# Patient Record
Sex: Female | Born: 1970
Health system: Southern US, Community
[De-identification: ages and names within clinical notes are randomized; demographics above are authoritative.]

## PROBLEM LIST (undated history)

## (undated) DIAGNOSIS — Z8489 Family history of other specified conditions: Secondary | ICD-10-CM

## (undated) DIAGNOSIS — N840 Polyp of corpus uteri: Secondary | ICD-10-CM

## (undated) DIAGNOSIS — M1990 Unspecified osteoarthritis, unspecified site: Secondary | ICD-10-CM

---

## 1994-08-20 HISTORY — PX: DILATION AND CURETTAGE OF UTERUS: SHX78

## 2007-09-08 ENCOUNTER — Other Ambulatory Visit: Admission: RE | Admit: 2007-09-08 | Discharge: 2007-09-08 | Payer: Self-pay | Admitting: Emergency Medicine

## 2008-12-03 ENCOUNTER — Ambulatory Visit (HOSPITAL_COMMUNITY): Admission: RE | Admit: 2008-12-03 | Discharge: 2008-12-03 | Payer: Self-pay | Admitting: Emergency Medicine

## 2008-12-07 ENCOUNTER — Other Ambulatory Visit: Admission: RE | Admit: 2008-12-07 | Discharge: 2008-12-07 | Payer: Self-pay | Admitting: Emergency Medicine

## 2010-05-18 ENCOUNTER — Other Ambulatory Visit: Admission: RE | Admit: 2010-05-18 | Discharge: 2010-05-18 | Payer: Self-pay | Admitting: Family Medicine

## 2011-04-03 ENCOUNTER — Other Ambulatory Visit (HOSPITAL_COMMUNITY): Payer: Self-pay | Admitting: Family Medicine

## 2011-04-03 DIAGNOSIS — Z1231 Encounter for screening mammogram for malignant neoplasm of breast: Secondary | ICD-10-CM

## 2011-04-06 ENCOUNTER — Ambulatory Visit (HOSPITAL_COMMUNITY)
Admission: RE | Admit: 2011-04-06 | Discharge: 2011-04-06 | Disposition: A | Discharge status: Home / Self Care | Payer: BC Managed Care – PPO | Source: Ambulatory Visit | Attending: Family Medicine | Admitting: Family Medicine

## 2011-04-06 DIAGNOSIS — Z1231 Encounter for screening mammogram for malignant neoplasm of breast: Secondary | ICD-10-CM | POA: Insufficient documentation

## 2011-05-22 ENCOUNTER — Other Ambulatory Visit: Payer: Self-pay | Admitting: Family Medicine

## 2011-05-22 ENCOUNTER — Other Ambulatory Visit (HOSPITAL_COMMUNITY)
Admission: RE | Admit: 2011-05-22 | Discharge: 2011-05-22 | Disposition: A | Discharge status: Home / Self Care | Payer: BC Managed Care – PPO | Source: Ambulatory Visit | Attending: Family Medicine | Admitting: Family Medicine

## 2011-05-22 DIAGNOSIS — Z1159 Encounter for screening for other viral diseases: Secondary | ICD-10-CM | POA: Insufficient documentation

## 2011-05-22 DIAGNOSIS — Z124 Encounter for screening for malignant neoplasm of cervix: Secondary | ICD-10-CM | POA: Insufficient documentation

## 2011-09-13 ENCOUNTER — Ambulatory Visit (INDEPENDENT_AMBULATORY_CARE_PROVIDER_SITE_OTHER): Payer: BC Managed Care – PPO | Admitting: Women's Health

## 2011-09-13 ENCOUNTER — Encounter: Payer: Self-pay | Admitting: Women's Health

## 2011-09-13 VITALS — BP 126/80 | Ht 65.25 in | Wt 158.0 lb

## 2011-09-13 DIAGNOSIS — N938 Other specified abnormal uterine and vaginal bleeding: Secondary | ICD-10-CM

## 2011-09-13 DIAGNOSIS — N949 Unspecified condition associated with female genital organs and menstrual cycle: Secondary | ICD-10-CM

## 2011-09-13 LAB — TSH: TSH: 2.655 u[IU]/mL (ref 0.350–4.500)

## 2011-09-13 LAB — PREGNANCY, URINE: Preg Test, Ur: NEGATIVE

## 2011-09-13 NOTE — Progress Notes (Signed)
Patient ID: Cynthia Trevino, female   DOB: September 27, 1970, 41 y.o.   MRN: [id number] New patient to our practice with a problem of DUB. 40yo MWF G2 P2 one partner for 20 years.  Was seen at [facility] family practice in October, had a normal Pap and mammogram was having some lower left sided pain at that time, has basically resolved. Has been having spotting between her periods for the last 3 months. She does normally have a monthly cycle for 4-5 days uses withdrawal for contraception. History of  abnormal Paps, LEEP in 96 with normal Paps after.  Exam: Moderate amount of menses type blood, cervix is pink healthy without polyp or lesion. Bimanual no CMT or adnexal fullness or tenderness.  DUB for 3 months  Plan: Contraception options were reviewed, Mirena IUD information given reviewed, reviewed slight risk for infection, perforation, hemorrhage. Instructed to schedule with Dr. Audie Box to place with cycle it so desires. TSH, prolactin pending, U PT negative. Sonohysterogram with Dr. Audie Box after her cycle.

## 2011-09-14 LAB — PROLACTIN: Prolactin: 5.6 ng/mL

## 2011-09-18 ENCOUNTER — Ambulatory Visit (INDEPENDENT_AMBULATORY_CARE_PROVIDER_SITE_OTHER): Payer: BC Managed Care – PPO | Admitting: Gynecology

## 2011-09-18 ENCOUNTER — Other Ambulatory Visit: Payer: BC Managed Care – PPO

## 2011-09-18 ENCOUNTER — Ambulatory Visit (INDEPENDENT_AMBULATORY_CARE_PROVIDER_SITE_OTHER): Payer: BC Managed Care – PPO

## 2011-09-18 ENCOUNTER — Ambulatory Visit: Payer: BC Managed Care – PPO | Admitting: Gynecology

## 2011-09-18 ENCOUNTER — Encounter: Payer: Self-pay | Admitting: Gynecology

## 2011-09-18 ENCOUNTER — Other Ambulatory Visit: Payer: Self-pay | Admitting: Gynecology

## 2011-09-18 DIAGNOSIS — N949 Unspecified condition associated with female genital organs and menstrual cycle: Secondary | ICD-10-CM

## 2011-09-18 DIAGNOSIS — N8300 Follicular cyst of ovary, unspecified side: Secondary | ICD-10-CM

## 2011-09-18 DIAGNOSIS — Z309 Encounter for contraceptive management, unspecified: Secondary | ICD-10-CM

## 2011-09-18 DIAGNOSIS — R1032 Left lower quadrant pain: Secondary | ICD-10-CM

## 2011-09-18 DIAGNOSIS — N938 Other specified abnormal uterine and vaginal bleeding: Secondary | ICD-10-CM

## 2011-09-18 NOTE — Patient Instructions (Signed)
Followup for IUD placement as we discussed. 

## 2011-09-18 NOTE — Progress Notes (Signed)
Patient follows up for sonohysterogram having recently seen Harriett Sine with midcycle breakthrough bleeding. Menses are otherwise regular and relatively light.  Ultrasound shows endometrial echo 6.2 mm. Homogeneous normal-appearing myometrium. Right and left ovaries with physiologic changes. Cul-de-sac negative. Sonohysterogram was performed, sterile technique, easy catheter introduction, good distention with no abnormalities. Endometrial sample was taken. Patient tolerated well.  Discussed with patient, I think that her breakthrough bleeding is a dysfunctional pattern. She does want to proceed with a Mirena IUD which I think certainly will provide a resolution to her spotting. She will return during her menses for placement. I reviewed the insertion process with her and the immediate risks to include infection and perforation requiring surgery to remove as well as the long-term issues of infection, migration,  as well as failure risks with pregnancy. Patient understands and will follow up for IUD placement.

## 2011-09-19 ENCOUNTER — Other Ambulatory Visit: Payer: Self-pay | Admitting: *Deleted

## 2011-09-19 DIAGNOSIS — Z3049 Encounter for surveillance of other contraceptives: Secondary | ICD-10-CM

## 2011-09-19 MED ORDER — LEVONORGESTREL 20 MCG/24HR IU IUD: INTRAUTERINE_SYSTEM | Freq: Once | INTRAUTERINE | Status: DC

## 2011-09-19 NOTE — Progress Notes (Signed)
Patient informed Mirena covered at 100%.

## 2011-10-09 ENCOUNTER — Encounter: Payer: Self-pay | Admitting: Gynecology

## 2011-10-09 ENCOUNTER — Ambulatory Visit (INDEPENDENT_AMBULATORY_CARE_PROVIDER_SITE_OTHER): Payer: BC Managed Care – PPO | Admitting: Gynecology

## 2011-10-09 DIAGNOSIS — Z30430 Encounter for insertion of intrauterine contraceptive device: Secondary | ICD-10-CM

## 2011-10-09 NOTE — Patient Instructions (Signed)
Follow up in one month for IUD check

## 2011-10-09 NOTE — Progress Notes (Signed)
Patient presents for Mirena IUD placement. Recently had sonohysterogram which was negative an endometrial sample showed a small benign polyp that was not appreciated on the sonohysterogram. Patient has read through the booklet has no questions and signed the consent form. I reviewed the insertional process with her and the risks to include the short-term risks of infection and perforation requiring surgery to remove as well as long-term risks of infection, migration, pregnancy. Patient understands and agrees. She is currently on a normal menses.  Exam with Sherrilyn Rist chaperone present external BUS vagina normal with slight menses flow. Cervix normal. Uterus anteverted normal size midline mobile nontender. Adnexa without masses or tenderness  Procedure: Cervix was cleansed with Betadine, anterior lip grasped with a single-tooth tenaculum, sounded and a Mirena IUD was placed according to manufacturer's recommendations without difficulty. The strings were trimmed. The patient tolerated well and will follow up in one month for postinsertional check.

## 2011-10-10 ENCOUNTER — Other Ambulatory Visit: Payer: Self-pay | Admitting: Gynecology

## 2011-10-10 DIAGNOSIS — Z3049 Encounter for surveillance of other contraceptives: Secondary | ICD-10-CM

## 2011-10-22 ENCOUNTER — Telehealth: Payer: Self-pay | Admitting: *Deleted

## 2011-10-22 NOTE — Telephone Encounter (Signed)
Recommend OV to let me take a look.

## 2011-10-22 NOTE — Telephone Encounter (Signed)
Pt informed with the below note. 

## 2011-10-22 NOTE — Telephone Encounter (Signed)
Pt had IUD placement on feb 19. Pt said that she tried to feel for her string on Saturday for the first time and couldn't find it and tried again yesterday and still no string. She has no other complains. Pt wanted you to be aware of this. Please advise

## 2011-10-23 ENCOUNTER — Ambulatory Visit (INDEPENDENT_AMBULATORY_CARE_PROVIDER_SITE_OTHER): Payer: BC Managed Care – PPO | Admitting: Gynecology

## 2011-10-23 ENCOUNTER — Encounter: Payer: Self-pay | Admitting: Gynecology

## 2011-10-23 DIAGNOSIS — Z30431 Encounter for routine checking of intrauterine contraceptive device: Secondary | ICD-10-CM

## 2011-10-23 NOTE — Patient Instructions (Signed)
Follow up in the fall 2013 for your annual exam

## 2011-10-23 NOTE — Progress Notes (Signed)
Patient presents because she could not feel the IUD string. She had recently placed 10/09/2011. She's not having any pain or any problems with it no abnormal bleeding.  Exam with a knee chaperone present. Pelvic external BUS vagina normal. Cervix normal with IUD string visualized. I showed it to the patient with a mirror. Bimanual uterus normal size midline mobile nontender. Adnexa without masses or tenderness.  Assessment and plan: IUD check normal. Patient will keep menstrual calendar, as long as she continues well then she'll follow up in the fall when she is due for her annual exam.

## 2011-11-06 ENCOUNTER — Ambulatory Visit: Payer: BC Managed Care – PPO | Admitting: Gynecology

## 2012-05-05 ENCOUNTER — Ambulatory Visit
Admission: RE | Admit: 2012-05-05 | Discharge: 2012-05-05 | Disposition: A | Discharge status: Home / Self Care | Payer: BC Managed Care – PPO | Source: Ambulatory Visit | Attending: Family Medicine | Admitting: Family Medicine

## 2012-05-05 ENCOUNTER — Other Ambulatory Visit: Payer: Self-pay | Admitting: Family Medicine

## 2012-05-05 DIAGNOSIS — R109 Unspecified abdominal pain: Secondary | ICD-10-CM

## 2012-05-05 MED ORDER — IOHEXOL 300 MG/ML  SOLN
100.0000 mL | Freq: Once | INTRAMUSCULAR | Status: AC | PRN
  Administered 2012-05-05: 100 mL via INTRAVENOUS

## 2012-06-05 ENCOUNTER — Other Ambulatory Visit (HOSPITAL_COMMUNITY): Payer: Self-pay | Admitting: Family Medicine

## 2012-06-05 DIAGNOSIS — Z1231 Encounter for screening mammogram for malignant neoplasm of breast: Secondary | ICD-10-CM

## 2012-06-09 ENCOUNTER — Ambulatory Visit (HOSPITAL_COMMUNITY)
Admission: RE | Admit: 2012-06-09 | Discharge: 2012-06-09 | Disposition: A | Discharge status: Home / Self Care | Payer: BC Managed Care – PPO | Source: Ambulatory Visit | Attending: Family Medicine | Admitting: Family Medicine

## 2012-06-09 DIAGNOSIS — Z1231 Encounter for screening mammogram for malignant neoplasm of breast: Secondary | ICD-10-CM

## 2012-06-10 ENCOUNTER — Other Ambulatory Visit: Payer: Self-pay | Admitting: Family Medicine

## 2012-06-10 DIAGNOSIS — N63 Unspecified lump in unspecified breast: Secondary | ICD-10-CM

## 2012-06-13 ENCOUNTER — Ambulatory Visit
Admission: RE | Admit: 2012-06-13 | Discharge: 2012-06-13 | Disposition: A | Discharge status: Home / Self Care | Payer: BC Managed Care – PPO | Source: Ambulatory Visit | Attending: Family Medicine | Admitting: Family Medicine

## 2012-06-13 DIAGNOSIS — N63 Unspecified lump in unspecified breast: Secondary | ICD-10-CM

## 2012-06-19 ENCOUNTER — Ambulatory Visit (HOSPITAL_COMMUNITY): Payer: BC Managed Care – PPO

## 2012-09-15 ENCOUNTER — Encounter: Payer: Self-pay | Admitting: Women's Health

## 2012-09-15 ENCOUNTER — Other Ambulatory Visit (HOSPITAL_COMMUNITY)
Admission: RE | Admit: 2012-09-15 | Discharge: 2012-09-15 | Disposition: A | Discharge status: Home / Self Care | Payer: BC Managed Care – PPO | Source: Ambulatory Visit | Attending: Women's Health | Admitting: Women's Health

## 2012-09-15 ENCOUNTER — Ambulatory Visit (INDEPENDENT_AMBULATORY_CARE_PROVIDER_SITE_OTHER): Payer: BC Managed Care – PPO | Admitting: Women's Health

## 2012-09-15 VITALS — BP 116/72 | Ht 65.5 in | Wt 166.0 lb

## 2012-09-15 DIAGNOSIS — Z833 Family history of diabetes mellitus: Secondary | ICD-10-CM

## 2012-09-15 DIAGNOSIS — Z01419 Encounter for gynecological examination (general) (routine) without abnormal findings: Secondary | ICD-10-CM | POA: Insufficient documentation

## 2012-09-15 DIAGNOSIS — Z1151 Encounter for screening for human papillomavirus (HPV): Secondary | ICD-10-CM | POA: Insufficient documentation

## 2012-09-15 DIAGNOSIS — N879 Dysplasia of cervix uteri, unspecified: Secondary | ICD-10-CM

## 2012-09-15 DIAGNOSIS — Z975 Presence of (intrauterine) contraceptive device: Secondary | ICD-10-CM

## 2012-09-15 DIAGNOSIS — Z13220 Encounter for screening for lipoid disorders: Secondary | ICD-10-CM

## 2012-09-15 LAB — CBC WITH DIFFERENTIAL/PLATELET
Basophils Absolute: 0 10*3/uL (ref 0.0–0.1)
Basophils Relative: 1 % (ref 0–1)
MCHC: 34.4 g/dL (ref 30.0–36.0)
Neutro Abs: 4.3 10*3/uL (ref 1.7–7.7)
Neutrophils Relative %: 56 % (ref 43–77)
Platelets: 260 10*3/uL (ref 150–400)
RDW: 13.1 % (ref 11.5–15.5)

## 2012-09-15 LAB — LIPID PANEL: Cholesterol: 186 mg/dL (ref 0–200)

## 2012-09-15 LAB — GLUCOSE, RANDOM: Glucose, Bld: 99 mg/dL (ref 70–99)

## 2012-09-15 NOTE — Patient Instructions (Addendum)

## 2012-09-15 NOTE — Progress Notes (Signed)
Cynthia Trevino 1971-01-11 [id number]    History:    The patient presents for annual exam.  Mirena IUD placed 09/2011 Dr. Audie Box. Has regular cycles some months, continues to spot some days and some months with no bleeding. History of a LEEP in 1996 reports normal Paps after (moved here from [place]) Pap 2013 normal. Normal mammogram history.   Past medical history, past surgical history, family history and social history were all reviewed and documented in the EPIC chart. Father history of colon cancer at age 59, currently esophageal cancer. Children ages 32 and 69 both doing well.   ROS:  A  ROS was performed and pertinent positives and negatives are included in the history.  Exam:  Filed Vitals:   09/15/12 0925  BP: 116/72    General appearance:  Normal Head/Neck:  Normal, without cervical or supraclavicular adenopathy. Thyroid:  Symmetrical, normal in size, without palpable masses or nodularity. Respiratory  Effort:  Normal  Auscultation:  Clear without wheezing or rhonchi Cardiovascular  Auscultation:  Regular rate, without rubs, murmurs or gallops  Edema/varicosities:  Not grossly evident Abdominal  Soft,nontender, without masses, guarding or rebound.  Liver/spleen:  No organomegaly noted  Hernia:  None appreciated  Skin  Inspection:  Grossly normal  Palpation:  Grossly normal Neurologic/psychiatric  Orientation:  Normal with appropriate conversation.  Mood/affect:  Normal  Genitourinary    Breasts: Examined lying and sitting.     Right: Without masses, retractions, discharge or axillary adenopathy.     Left: Without masses, retractions, discharge or axillary adenopathy.   Inguinal/mons:  Normal without inguinal adenopathy  External genitalia:  Normal  BUS/Urethra/Skene's glands:  Normal  Bladder:  Normal  Vagina:  Normal  Cervix:  Normal IUD strings visible  Uterus:  normal in size, shape and contour.  Midline and mobile  Adnexa/parametria:      Rt: Without masses or tenderness.   Lt: Without masses or tenderness.  Anus and perineum: Normal  Digital rectal exam: Normal sphincter tone without palpated masses or tenderness  Assessment/Plan:  42 y.o. MWF G2 P2 for annual exam.   Mirena IUD placed 09/2011 with occasional spotting History of LEEP 1996-normal Paps after  Plan: SBE's, annual mammogram, calcium rich diet, regular exercise encouraged, decrease calories for weight loss has gained 8 pounds in the past year. Colonoscopy encouraged at age 71, father had colon cancer at age 20. CBC, glucose, lipid panel, UA, Pap.    Harrington Challenger [facility], 9:46 AM 09/15/2012

## 2012-09-15 NOTE — Addendum Note (Signed)
Addended by: Richardson Chiquito on: 09/15/2012 03:53 PM   Modules accepted: Orders

## 2012-09-16 LAB — URINALYSIS W MICROSCOPIC + REFLEX CULTURE
Bilirubin Urine: NEGATIVE
Crystals: NONE SEEN
Glucose, UA: NEGATIVE mg/dL
Specific Gravity, Urine: 1.025 (ref 1.005–1.030)
Urobilinogen, UA: 0.2 mg/dL (ref 0.0–1.0)

## 2012-10-04 ENCOUNTER — Other Ambulatory Visit: Payer: Self-pay

## 2013-06-15 ENCOUNTER — Other Ambulatory Visit (HOSPITAL_COMMUNITY): Payer: Self-pay | Admitting: Family Medicine

## 2013-06-15 DIAGNOSIS — Z1231 Encounter for screening mammogram for malignant neoplasm of breast: Secondary | ICD-10-CM

## 2013-06-25 ENCOUNTER — Other Ambulatory Visit: Payer: Self-pay

## 2013-07-06 ENCOUNTER — Ambulatory Visit (HOSPITAL_COMMUNITY)
Admission: RE | Admit: 2013-07-06 | Discharge: 2013-07-06 | Disposition: A | Discharge status: Home / Self Care | Payer: BC Managed Care – PPO | Source: Ambulatory Visit | Attending: Family Medicine | Admitting: Family Medicine

## 2013-07-06 DIAGNOSIS — Z1231 Encounter for screening mammogram for malignant neoplasm of breast: Secondary | ICD-10-CM | POA: Insufficient documentation

## 2013-09-17 ENCOUNTER — Encounter: Payer: Self-pay | Admitting: Women's Health

## 2013-09-25 ENCOUNTER — Other Ambulatory Visit (HOSPITAL_COMMUNITY)
Admission: RE | Admit: 2013-09-25 | Discharge: 2013-09-25 | Disposition: A | Discharge status: Home / Self Care | Payer: BC Managed Care – PPO | Source: Ambulatory Visit | Attending: Gynecology | Admitting: Gynecology

## 2013-09-25 ENCOUNTER — Ambulatory Visit (INDEPENDENT_AMBULATORY_CARE_PROVIDER_SITE_OTHER): Payer: No Typology Code available for payment source | Admitting: Women's Health

## 2013-09-25 ENCOUNTER — Encounter: Payer: Self-pay | Admitting: Women's Health

## 2013-09-25 VITALS — BP 112/80 | Ht 65.5 in | Wt 170.4 lb

## 2013-09-25 DIAGNOSIS — N898 Other specified noninflammatory disorders of vagina: Secondary | ICD-10-CM

## 2013-09-25 DIAGNOSIS — Z01419 Encounter for gynecological examination (general) (routine) without abnormal findings: Secondary | ICD-10-CM | POA: Insufficient documentation
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
## 2013-09-25 NOTE — Addendum Note (Signed)
Addended by: WILKINSON, KARI S on: 09/25/2013 05:04 PM   Modules accepted: Orders  

## 2013-09-25 NOTE — Patient Instructions (Signed)

## 2013-09-26 LAB — GC/CHLAMYDIA PROBE AMP
CT Probe RNA: NEGATIVE
GC PROBE AMP APTIMA: NEGATIVE

## 2014-02-05 ENCOUNTER — Ambulatory Visit (INDEPENDENT_AMBULATORY_CARE_PROVIDER_SITE_OTHER): Payer: No Typology Code available for payment source | Admitting: Women's Health

## 2014-02-05 DIAGNOSIS — N926 Irregular menstruation, unspecified: Secondary | ICD-10-CM

## 2014-02-05 MED ORDER — MEGESTROL ACETATE 40 MG PO TABS: 40.0000 mg | ORAL_TABLET | Freq: Two times a day (BID) | ORAL | Status: DC
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
## 2014-03-10 ENCOUNTER — Emergency Department (INDEPENDENT_AMBULATORY_CARE_PROVIDER_SITE_OTHER): Payer: No Typology Code available for payment source

## 2014-03-10 ENCOUNTER — Encounter (HOSPITAL_COMMUNITY): Payer: Self-pay | Admitting: Emergency Medicine

## 2014-03-10 ENCOUNTER — Emergency Department (HOSPITAL_COMMUNITY)
Admission: EM | Admit: 2014-03-10 | Discharge: 2014-03-10 | Disposition: A | Discharge status: Home / Self Care | Payer: No Typology Code available for payment source | Source: Home / Self Care | Attending: Family Medicine | Admitting: Family Medicine

## 2014-03-10 DIAGNOSIS — M545 Low back pain, unspecified: Secondary | ICD-10-CM

## 2014-03-10 LAB — POCT URINALYSIS DIP (DEVICE)
Bilirubin Urine: NEGATIVE
GLUCOSE, UA: NEGATIVE mg/dL
Ketones, ur: NEGATIVE mg/dL
Nitrite: NEGATIVE
Protein, ur: NEGATIVE mg/dL
UROBILINOGEN UA: 0.2 mg/dL (ref 0.0–1.0)
pH: 5.5 (ref 5.0–8.0)

## 2014-03-10 LAB — POCT PREGNANCY, URINE: PREG TEST UR: NEGATIVE

## 2014-03-10 MED ORDER — PREDNISONE 10 MG PO KIT: PACK | ORAL | Status: DC

## 2014-03-10 MED ORDER — TRAMADOL HCL 50 MG PO TABS: 50.0000 mg | ORAL_TABLET | Freq: Four times a day (QID) | ORAL | Status: DC | PRN

## 2014-03-10 NOTE — ED Notes (Signed)
C/o lower back pain for 2 wks Did see pcp and received a Toradol shot and rx meds  States not able to sleep due to pain

## 2014-03-10 NOTE — ED Provider Notes (Signed)
Cynthia Trevino is a 43 y.o. female who presents to Urgent Care today for low back pain present for 2 weeks. Patient is moderate to severe low back pain worsening over the past 2 days. Patient denies any radiating pain weakness or numbness. The pain is worse with activity better with rest. She was seen by her primary care provider who gave a Toradol shot and used muscle relaxers which have not helped. No fevers or chills nausea vomiting or diarrhea.   Past Medical History  Diagnosis Date  . Cervical cancer 1996    LEEP & D&C DONE  . IUD 10/09/2011    Mirena   History  Substance Use Topics  . Smoking status: Never Smoker   . Smokeless tobacco: Never Used  . Alcohol Use: 0.6 oz/week    1 Glasses of wine per week     Comment: occassional   ROS as above Medications: No current facility-administered medications for this encounter.   Current Outpatient Prescriptions  Medication Sig Dispense Refill  . Calcium Carbonate-Vitamin D (CALCIUM + D PO) Take by mouth.      . levonorgestrel (MIRENA) 20 MCG/24HR IUD 1 each by Intrauterine route once.      . megestrol (MEGACE) 40 MG tablet Take 1 tablet (40 mg total) by mouth 2 (two) times daily.  30 tablet  0  . Multiple Vitamin (MULTIVITAMIN) tablet Take 1 tablet by mouth daily.      . PredniSONE 10 MG KIT 12 day dose pack po  1 kit  0  . traMADol (ULTRAM) 50 MG tablet Take 1 tablet (50 mg total) by mouth every 6 (six) hours as needed.  15 tablet  0    Exam:  BP 125/76  Pulse 94  Temp(Src) 98.6 F (37 C) (Oral)  Resp 18  SpO2 98%  LMP 12/24/2013 Gen: Well NAD Back: Nontender to spinal midline. Tender palpation right SI joint. Negative straight leg raise test bilaterally. Positive pretzel stretch right negative left. Negative Faber test bilaterally. Lower extremity strength reflexes sensation are intact  Results for orders placed during the hospital encounter of 03/10/14 (from the past 24 hour(s))  POCT URINALYSIS DIP (DEVICE)      Status: Abnormal   Collection Time    03/10/14  2:32 PM      Result Value Ref Range   Glucose, UA NEGATIVE  NEGATIVE mg/dL   Bilirubin Urine NEGATIVE  NEGATIVE   Ketones, ur NEGATIVE  NEGATIVE mg/dL   Specific Gravity, Urine >=1.030  1.005 - 1.030   Hgb urine dipstick LARGE (*) NEGATIVE   pH 5.5  5.0 - 8.0   Protein, ur NEGATIVE  NEGATIVE mg/dL   Urobilinogen, UA 0.2  0.0 - 1.0 mg/dL   Nitrite NEGATIVE  NEGATIVE   Leukocytes, UA TRACE (*) NEGATIVE  POCT PREGNANCY, URINE     Status: None   Collection Time    03/10/14  2:36 PM      Result Value Ref Range   Preg Test, Ur NEGATIVE  NEGATIVE   Dg Lumbar Spine Complete  03/10/2014   CLINICAL DATA:  Lower back pain, no known injury  EXAM: LUMBAR SPINE - COMPLETE 4+ VIEW  COMPARISON:  Prior CT abdomen/ pelvis 05/05/2012  FINDINGS: Rotary levoconvex scoliosis of the lumbar spine centered at L2. There are 5 non-rib-bearing lumbar vertebra. Very mild changes of degenerative disc disease at L3-L4. No acute bony abnormality or lytic or blastic osseous lesion. IUD projects over the anatomic pelvis. Unremarkable visualized bowel gas pattern.  IMPRESSION: 1. Rotary, levoconvex scoliosis of the lumbar spine centered at L2. 2. Very mild degenerative disc disease at L3-L4. 3. No acute fracture, malalignment or bony abnormality. 4. IUD.   Electronically Signed   By: Jacqulynn Cadet M.D.   On: 03/10/2014 15:06    Assessment and Plan: 43 y.o. female with lumbago likely due to myofascial disruption of the lumbar muscles complicated by history of scoliosis and DDD. Plan to treat with prednisone Dosepak and tramadol. Refer to physical therapy. Followup with primary care provider.  Discussed warning signs or symptoms. Please see discharge instructions. Patient expresses understanding.   This note was created using Systems analyst. Any transcription errors are unintended.    Gregor Hams, MD 03/10/14 [phone]

## 2014-03-10 NOTE — Discharge Instructions (Signed)
Thank you for coming in today. Come back or go to the emergency room if you notice new weakness new numbness problems walking or bowel or bladder problems.   Back Exercises Back exercises help treat and prevent back injuries. The goal of back exercises is to increase the strength of your abdominal and back muscles and the flexibility of your back. These exercises should be started when you no longer have back pain. Back exercises include:  Pelvic Tilt. Lie on your back with your knees bent. Tilt your pelvis until the lower part of your back is against the floor. Hold this position 5 to 10 sec and repeat 5 to 10 times.  Knee to Chest. Pull first 1 knee up against your chest and hold for 20 to 30 seconds, repeat this with the other knee, and then both knees. This may be done with the other leg straight or bent, whichever feels better.  Sit-Ups or Curl-Ups. Bend your knees 90 degrees. Start with tilting your pelvis, and do a partial, slow sit-up, lifting your trunk only 30 to 45 degrees off the floor. Take at least 2 to 3 seconds for each sit-up. Do not do sit-ups with your knees out straight. If partial sit-ups are difficult, simply do the above but with only tightening your abdominal muscles and holding it as directed.  Hip-Lift. Lie on your back with your knees flexed 90 degrees. Push down with your feet and shoulders as you raise your hips a couple inches off the floor; hold for 10 seconds, repeat 5 to 10 times.  Back arches. Lie on your stomach, propping yourself up on bent elbows. Slowly press on your hands, causing an arch in your low back. Repeat 3 to 5 times. Any initial stiffness and discomfort should lessen with repetition over time.  Shoulder-Lifts. Lie face down with arms beside your body. Keep hips and torso pressed to floor as you slowly lift your head and shoulders off the floor. Do not overdo your exercises, especially in the beginning. Exercises may cause you some mild back  discomfort which lasts for a few minutes; however, if the pain is more severe, or lasts for more than 15 minutes, do not continue exercises until you see your caregiver. Improvement with exercise therapy for back problems is slow.  See your caregivers for assistance with developing a proper back exercise program. Document Released: 09/13/2004 Document Revised: 10/29/2011 Document Reviewed: 06/07/2011 [facility] Patient Information 2015 [employer], [employer]. This information is not intended to replace advice given to you by your health care provider. Make sure you discuss any questions you have with your health care provider.

## 2014-06-04 ENCOUNTER — Other Ambulatory Visit (HOSPITAL_COMMUNITY): Payer: Self-pay | Admitting: Family Medicine

## 2014-06-04 DIAGNOSIS — Z1231 Encounter for screening mammogram for malignant neoplasm of breast: Secondary | ICD-10-CM

## 2014-06-21 ENCOUNTER — Encounter (HOSPITAL_COMMUNITY): Payer: Self-pay | Admitting: Emergency Medicine

## 2014-07-07 ENCOUNTER — Ambulatory Visit (HOSPITAL_COMMUNITY)
Admission: RE | Admit: 2014-07-07 | Discharge: 2014-07-07 | Disposition: A | Discharge status: Home / Self Care | Payer: PRIVATE HEALTH INSURANCE | Source: Ambulatory Visit | Attending: Family Medicine | Admitting: Family Medicine

## 2014-07-07 DIAGNOSIS — Z1231 Encounter for screening mammogram for malignant neoplasm of breast: Secondary | ICD-10-CM | POA: Insufficient documentation

## 2014-09-30 ENCOUNTER — Encounter: Payer: Self-pay | Admitting: Women's Health

## 2014-09-30 ENCOUNTER — Ambulatory Visit (INDEPENDENT_AMBULATORY_CARE_PROVIDER_SITE_OTHER): Payer: BLUE CROSS/BLUE SHIELD | Admitting: Women's Health

## 2014-09-30 VITALS — BP 138/80 | Ht 65.0 in | Wt 173.0 lb

## 2014-09-30 DIAGNOSIS — B373 Candidiasis of vulva and vagina: Secondary | ICD-10-CM

## 2014-09-30 DIAGNOSIS — Z13220 Encounter for screening for lipoid disorders: Secondary | ICD-10-CM

## 2014-09-30 DIAGNOSIS — Z01419 Encounter for gynecological examination (general) (routine) without abnormal findings: Secondary | ICD-10-CM

## 2014-09-30 DIAGNOSIS — Z833 Family history of diabetes mellitus: Secondary | ICD-10-CM

## 2014-09-30 DIAGNOSIS — B3731 Acute candidiasis of vulva and vagina: Secondary | ICD-10-CM

## 2014-09-30 DIAGNOSIS — N926 Irregular menstruation, unspecified: Secondary | ICD-10-CM

## 2014-09-30 DIAGNOSIS — Z113 Encounter for screening for infections with a predominantly sexual mode of transmission: Secondary | ICD-10-CM

## 2014-09-30 LAB — WET PREP FOR TRICH, YEAST, CLUE
Clue Cells Wet Prep HPF POC: NONE SEEN
Trich, Wet Prep: NONE SEEN

## 2014-09-30 MED ORDER — FLUCONAZOLE 150 MG PO TABS
150.0000 mg | ORAL_TABLET | Freq: Once | ORAL | Status: DC
Start: 2014-09-30 — End: 2015-10-05

## 2014-09-30 NOTE — Progress Notes (Signed)
Cynthia Trevino 09/24/1970 [id number]    History:    Presents for annual exam.  Irregular cycles on Mirena IUD with occasional pain, Mirena placed 09/2011 and questions its position. 1996 LEEP with normal Paps after. Normal mammogram history. Process of divorce has had counseling. Father colon cancer age 46.  Past medical history, past surgical history, family history and social history were all reviewed and documented in the EPIC chart. Son 5018, daughter 1914 both doing well and have received gardasil. Maternal aunt breast cancer  ROS:  A ROS was performed and pertinent positives and negatives are included.  Exam:  Filed Vitals:   09/30/14 1124  BP: 138/80    General appearance:  Normal Thyroid:  Symmetrical, normal in size, without palpable masses or nodularity. Respiratory  Auscultation:  Clear without wheezing or rhonchi Cardiovascular  Auscultation:  Regular rate, without rubs, murmurs or gallops  Edema/varicosities:  Not grossly evident Abdominal  Soft,nontender, without masses, guarding or rebound.  Liver/spleen:  No organomegaly noted  Hernia:  None appreciated  Skin  Inspection:  Grossly normal   Breasts: Examined lying and sitting.     Right: Without masses, retractions, discharge or axillary adenopathy.     Left: Without masses, retractions, discharge or axillary adenopathy. Gentitourinary   Inguinal/mons:  Normal without inguinal adenopathy  External genitalia:  Normal  BUS/Urethra/Skene's glands:  Normal  Vagina:  Moderate white discharge, wet prep positive for yeast  Cervix:  Normal IUD strings visible  Uterus:   normal in size, shape and contour.  Midline and mobile  Adnexa/parametria:     Rt: Without masses or tenderness.   Lt: Without masses or tenderness.  Anus and perineum: Normal  Digital rectal exam: Normal sphincter tone without palpated masses or tenderness  Assessment/Plan:  44 y.o. M WF G2 options to  for annual exam.   1996 LEEP normal Paps  after 09/2011 Mirena IUD with occasional pelvic pain and irregular bleeding Yeast vaginitis STD screen  Plan: Schedule ultrasound to check placement. SBE's, continue annual screening mammogram, calcium rich diet, vitamin D 1000 IUs daily encouraged. Diflucan 150 by mouth 1 dose prescription, proper use given and reviewed. Instructed to call if no relief of discharge. GC/Chlamydia, HIV, hep B, C, RPR. Condoms encouraged if sexually active. CBC, TSH, lipid panel, UA, glucose, Pap normal 2015, new screening guidelines reviewed. Counseling as needed for impending divorce. Schedule screening colonoscopy, father colon cancer age 356.   [name],Cynthia J Doctors [facility] HospitalWHNP, 12:47 PM 09/30/2014

## 2014-09-30 NOTE — Patient Instructions (Addendum)
Health Maintenance Adopting a healthy lifestyle and getting preventive care can go a long way to promote health and wellness. Talk with your health care provider about what schedule of regular examinations is right for you. This is a good chance for you to check in with your provider about disease prevention and staying healthy. In between checkups, there are plenty of things you can do on your own. Experts have done a lot of research about which lifestyle changes and preventive measures are most likely to keep you healthy. Ask your health care provider for more information. WEIGHT AND DIET  Eat a healthy diet  Be sure to include plenty of vegetables, fruits, low-fat dairy products, and lean protein.  Do not eat a lot of foods high in solid fats, added sugars, or salt.  Get regular exercise. This is one of the most important things you can do for your health.  Most adults should exercise for at least 150 minutes each week. The exercise should increase your heart rate and make you sweat (moderate-intensity exercise).  Most adults should also do strengthening exercises at least twice a week. This is in addition to the moderate-intensity exercise.  Maintain a healthy weight  Body mass index (BMI) is a measurement that can be used to identify possible weight problems. It estimates body fat based on height and weight. Your health care provider can help determine your BMI and help you achieve or maintain a healthy weight.  For females 61 years of age and older:   A BMI below 18.5 is considered underweight.  A BMI of 18.5 to 24.9 is normal.  A BMI of 25 to 29.9 is considered overweight.  A BMI of 30 and above is considered obese.  Watch levels of cholesterol and blood lipids  You should start having your blood tested for lipids and cholesterol at 46 years of age, then have this test every 5 years.  You may need to have your cholesterol levels checked more often if:  Your lipid or  cholesterol levels are high.  You are older than 46 years of age.  You are at high risk for heart disease.  CANCER SCREENING   Lung Cancer  Lung cancer screening is recommended for adults 77-19 years old who are at high risk for lung cancer because of a history of smoking.  A yearly low-dose CT scan of the lungs is recommended for people who:  Currently smoke.  Have quit within the past 15 years.  Have at least a 30-pack-year history of smoking. A pack year is smoking an average of one pack of cigarettes a day for 1 year.  Yearly screening should continue until it has been 15 years since you quit.  Yearly screening should stop if you develop a health problem that would prevent you from having lung cancer treatment.  Breast Cancer  Practice breast self-awareness. This means understanding how your breasts normally appear and feel.  It also means doing regular breast self-exams. Let your health care provider know about any changes, no matter how small.  If you are in your 20s or 30s, you should have a clinical breast exam (CBE) by a health care provider every 1-3 years as part of a regular health exam.  If you are 15 or older, have a CBE every year. Also consider having a breast X-ray (mammogram) every year.  If you have a family history of breast cancer, talk to your health care provider about genetic screening.  If you are  at high risk for breast cancer, talk to your health care provider about having an MRI and a mammogram every year.  Breast cancer gene (BRCA) assessment is recommended for women who have family members with BRCA-related cancers. BRCA-related cancers include:  Breast.  Ovarian.  Tubal.  Peritoneal cancers.  Results of the assessment will determine the need for genetic counseling and BRCA1 and BRCA2 testing. Cervical Cancer Routine pelvic examinations to screen for cervical cancer are no longer recommended for nonpregnant women who are considered low  risk for cancer of the pelvic organs (ovaries, uterus, and vagina) and who do not have symptoms. A pelvic examination may be necessary if you have symptoms including those associated with pelvic infections. Ask your health care provider if a screening pelvic exam is right for you.   The Pap test is the screening test for cervical cancer for women who are considered at risk.  If you had a hysterectomy for a problem that was not cancer or a condition that could lead to cancer, then you no longer need Pap tests.  If you are older than 65 years, and you have had normal Pap tests for the past 10 years, you no longer need to have Pap tests.  If you have had past treatment for cervical cancer or a condition that could lead to cancer, you need Pap tests and screening for cancer for at least 20 years after your treatment.  If you no longer get a Pap test, assess your risk factors if they change (such as having a new sexual partner). This can affect whether you should start being screened again.  Some women have medical problems that increase their chance of getting cervical cancer. If this is the case for you, your health care provider may recommend more frequent screening and Pap tests.  The human papillomavirus (HPV) test is another test that may be used for cervical cancer screening. The HPV test looks for the virus that can cause cell changes in the cervix. The cells collected during the Pap test can be tested for HPV.  The HPV test can be used to screen women 30 years of age and older. Getting tested for HPV can extend the interval between normal Pap tests from three to five years.  An HPV test also should be used to screen women of any age who have unclear Pap test results.  After 46 years of age, women should have HPV testing as often as Pap tests.  Colorectal Cancer  This type of cancer can be detected and often prevented.  Routine colorectal cancer screening usually begins at 46 years of  age and continues through 46 years of age.  Your health care provider may recommend screening at an earlier age if you have risk factors for colon cancer.  Your health care provider may also recommend using home test kits to check for hidden blood in the stool.  A small camera at the end of a tube can be used to examine your colon directly (sigmoidoscopy or colonoscopy). This is done to check for the earliest forms of colorectal cancer.  Routine screening usually begins at age 50.  Direct examination of the colon should be repeated every 5-10 years through 46 years of age. However, you may need to be screened more often if early forms of precancerous polyps or small growths are found. Skin Cancer  Check your skin from head to toe regularly.  Tell your health care provider about any new moles or changes in   moles, especially if there is a change in a mole's shape or color.  Also tell your health care provider if you have a mole that is larger than the size of a pencil eraser.  Always use sunscreen. Apply sunscreen liberally and repeatedly throughout the day.  Protect yourself by wearing long sleeves, pants, a wide-brimmed hat, and sunglasses whenever you are outside. HEART DISEASE, DIABETES, AND HIGH BLOOD PRESSURE   Have your blood pressure checked at least every 1-2 years. High blood pressure causes heart disease and increases the risk of stroke.  If you are between 75 years and 42 years old, ask your health care provider if you should take aspirin to prevent strokes.  Have regular diabetes screenings. This involves taking a blood sample to check your fasting blood sugar level.  If you are at a normal weight and have a low risk for diabetes, have this test once every three years after 46 years of age.  If you are overweight and have a high risk for diabetes, consider being tested at a younger age or more often. PREVENTING INFECTION  Hepatitis B  If you have a higher risk for  hepatitis B, you should be screened for this virus. You are considered at high risk for hepatitis B if:  You were born in a country where hepatitis B is common. Ask your health care provider which countries are considered high risk.  Your parents were born in a high-risk country, and you have not been immunized against hepatitis B (hepatitis B vaccine).  You have HIV or AIDS.  You use needles to inject street drugs.  You live with someone who has hepatitis B.  You have had sex with someone who has hepatitis B.  You get hemodialysis treatment.  You take certain medicines for conditions, including cancer, organ transplantation, and autoimmune conditions. Hepatitis C  Blood testing is recommended for:  Everyone born from [birth year] through 1967.  Anyone with known risk factors for hepatitis C. Sexually transmitted infections (STIs)  You should be screened for sexually transmitted infections (STIs) including gonorrhea and chlamydia if:  You are sexually active and are younger than 46 years of age.  You are older than 46 years of age and your health care provider tells you that you are at risk for this type of infection.  Your sexual activity has changed since you were last screened and you are at an increased risk for chlamydia or gonorrhea. Ask your health care provider if you are at risk.  If you do not have HIV, but are at risk, it may be recommended that you take a prescription medicine daily to prevent HIV infection. This is called pre-exposure prophylaxis (PrEP). You are considered at risk if:  You are sexually active and do not regularly use condoms or know the HIV status of your partner(s).  You take drugs by injection.  You are sexually active with a partner who has HIV. Talk with your health care provider about whether you are at high risk of being infected with HIV. If you choose to begin PrEP, you should first be tested for HIV. You should then be tested every 3 months for  as long as you are taking PrEP.  PREGNANCY   If you are premenopausal and you may become pregnant, ask your health care provider about preconception counseling.  If you may become pregnant, take 400 to 800 micrograms (mcg) of folic acid every day.  If you want to prevent pregnancy, talk to your  health care provider about birth control (contraception). OSTEOPOROSIS AND MENOPAUSE   Osteoporosis is a disease in which the bones lose minerals and strength with aging. This can result in serious bone fractures. Your risk for osteoporosis can be identified using a bone density scan.  If you are 65 years of age or older, or if you are at risk for osteoporosis and fractures, ask your health care provider if you should be screened.  Ask your health care provider whether you should take a calcium or vitamin D supplement to lower your risk for osteoporosis.  Menopause may have certain physical symptoms and risks.  Hormone replacement therapy may reduce some of these symptoms and risks. Talk to your health care provider about whether hormone replacement therapy is right for you.  HOME CARE INSTRUCTIONS   Schedule regular health, dental, and eye exams.  Stay current with your immunizations.   Do not use any tobacco products including cigarettes, chewing tobacco, or electronic cigarettes.  If you are pregnant, do not drink alcohol.  If you are breastfeeding, limit how much and how often you drink alcohol.  Limit alcohol intake to no more than 1 drink per day for nonpregnant women. One drink equals 12 ounces of beer, 5 ounces of wine, or 1 ounces of hard liquor.  Do not use street drugs.  Do not share needles.  Ask your health care provider for help if you need support or information about quitting drugs.  Tell your health care provider if you often feel depressed.  Tell your health care provider if you have ever been abused or do not feel safe at home. Document Released: 02/19/2011  Document Revised: 12/21/2013 Document Reviewed: 07/08/2013 ExitCare Patient Information 2015 [employer]. This information is not intended to replace advice given to you by your health care provider. Make sure you discuss any questions you have with your health care provider. Monilial Vaginitis Vaginitis in a soreness, swelling and redness (inflammation) of the vagina and vulva. Monilial vaginitis is not a sexually transmitted infection. CAUSES  Yeast vaginitis is caused by yeast (candida) that is normally found in your vagina. With a yeast infection, the candida has overgrown in number to a point that upsets the chemical balance. SYMPTOMS   White, thick vaginal discharge.  Swelling, itching, redness and irritation of the vagina and possibly the lips of the vagina (vulva).  Burning or painful urination.  Painful intercourse. DIAGNOSIS  Things that may contribute to monilial vaginitis are:  Postmenopausal and virginal states.  Pregnancy.  Infections.  Being tired, sick or stressed, especially if you had monilial vaginitis in the past.  Diabetes. Good control will help lower the chance.  Birth control pills.  Tight fitting garments.  Using bubble bath, feminine sprays, douches or deodorant tampons.  Taking certain medications that kill germs (antibiotics).  Sporadic recurrence can occur if you become ill. TREATMENT  Your caregiver will give you medication.  There are several kinds of anti monilial vaginal creams and suppositories specific for monilial vaginitis. For recurrent yeast infections, use a suppository or cream in the vagina 2 times a week, or as directed.  Anti-monilial or steroid cream for the itching or irritation of the vulva may also be used. Get your caregiver's permission.  Painting the vagina with methylene blue solution may help if the monilial cream does not work.  Eating yogurt may help prevent monilial vaginitis. HOME CARE INSTRUCTIONS   Finish  all medication as prescribed.  Do not have sex until treatment is   completed or after your caregiver tells you it is okay.  Take warm sitz baths.  Do not douche.  Do not use tampons, especially scented ones.  Wear cotton underwear.  Avoid tight pants and panty hose.  Tell your sexual partner that you have a yeast infection. They should go to their caregiver if they have symptoms such as mild rash or itching.  Your sexual partner should be treated as well if your infection is difficult to eliminate.  Practice safer sex. Use condoms.  Some vaginal medications cause latex condoms to fail. Vaginal medications that harm condoms are:  Cleocin cream.  Butoconazole (Femstat).  Terconazole (Terazol) vaginal suppository.  Miconazole (Monistat) (may be purchased over the counter). SEEK MEDICAL CARE IF:   You have a temperature by mouth above 102 F (38.9 C).  The infection is getting worse after 2 days of treatment.  The infection is not getting better after 3 days of treatment.  You develop blisters in or around your vagina.  You develop vaginal bleeding, and it is not your menstrual period.  You have pain when you urinate.  You develop intestinal problems.  You have pain with sexual intercourse. Document Released: 05/16/2005 Document Revised: 10/29/2011 Document Reviewed: 01/28/2009 ExitCare Patient Information 2015 [employer]. This information is not intended to replace advice given to you by your health care provider. Make sure you discuss any questions you have with your health care provider.  

## 2014-09-30 NOTE — Addendum Note (Signed)
Addended by: Kem ParkinsonBARNES, Shaleta Ruacho on: 09/30/2014 03:29 PM   Modules accepted: Orders, SmartSet

## 2014-10-01 LAB — URINALYSIS W MICROSCOPIC + REFLEX CULTURE
BILIRUBIN URINE: NEGATIVE
CRYSTALS: NONE SEEN
Casts: NONE SEEN
GLUCOSE, UA: NEGATIVE mg/dL
Hgb urine dipstick: NEGATIVE
Ketones, ur: NEGATIVE mg/dL
Nitrite: NEGATIVE
Protein, ur: NEGATIVE mg/dL
Specific Gravity, Urine: 1.022 (ref 1.005–1.030)
UROBILINOGEN UA: 0.2 mg/dL (ref 0.0–1.0)
pH: 5.5 (ref 5.0–8.0)

## 2014-10-01 LAB — CBC WITH DIFFERENTIAL/PLATELET
Basophils Absolute: 0.1 10*3/uL (ref 0.0–0.1)
Basophils Relative: 1 % (ref 0–1)
EOS PCT: 2 % (ref 0–5)
Eosinophils Absolute: 0.2 10*3/uL (ref 0.0–0.7)
HEMATOCRIT: 41.7 % (ref 36.0–46.0)
Hemoglobin: 14.2 g/dL (ref 12.0–15.0)
LYMPHS ABS: 2.6 10*3/uL (ref 0.7–4.0)
Lymphocytes Relative: 30 % (ref 12–46)
MCH: 31.3 pg (ref 26.0–34.0)
MCHC: 34.1 g/dL (ref 30.0–36.0)
MCV: 91.9 fL (ref 78.0–100.0)
MONOS PCT: 6 % (ref 3–12)
MPV: 9.1 fL (ref 8.6–12.4)
Monocytes Absolute: 0.5 10*3/uL (ref 0.1–1.0)
Neutro Abs: 5.2 10*3/uL (ref 1.7–7.7)
Neutrophils Relative %: 61 % (ref 43–77)
Platelets: 305 10*3/uL (ref 150–400)
RBC: 4.54 MIL/uL (ref 3.87–5.11)
RDW: 13.2 % (ref 11.5–15.5)
WBC: 8.5 10*3/uL (ref 4.0–10.5)

## 2014-10-01 LAB — LIPID PANEL
Cholesterol: 228 mg/dL — ABNORMAL HIGH (ref 0–200)
HDL: 50 mg/dL (ref >39)
LDL CALC: 158 mg/dL — AB (ref 0–99)
Total CHOL/HDL Ratio: 4.6 Ratio
Triglycerides: 101 mg/dL (ref <150)
VLDL: 20 mg/dL (ref 0–40)

## 2014-10-01 LAB — HIV ANTIBODY (ROUTINE TESTING W REFLEX): HIV 1&2 Ab, 4th Generation: NONREACTIVE

## 2014-10-01 LAB — TSH: TSH: 2.732 u[IU]/mL (ref 0.350–4.500)

## 2014-10-01 LAB — GC/CHLAMYDIA PROBE AMP
CT Probe RNA: NEGATIVE
GC PROBE AMP APTIMA: NEGATIVE

## 2014-10-01 LAB — HEPATITIS C ANTIBODY: HCV Ab: NEGATIVE

## 2014-10-01 LAB — RPR

## 2014-10-01 LAB — GLUCOSE, RANDOM: Glucose, Bld: 101 mg/dL — ABNORMAL HIGH (ref 70–99)

## 2014-10-01 LAB — HEPATITIS B SURFACE ANTIGEN: Hepatitis B Surface Ag: NEGATIVE

## 2014-10-02 LAB — URINE CULTURE: Colony Count: 90000

## 2014-10-11 ENCOUNTER — Other Ambulatory Visit: Payer: Self-pay | Admitting: Gynecology

## 2014-10-11 DIAGNOSIS — N926 Irregular menstruation, unspecified: Secondary | ICD-10-CM

## 2014-10-14 ENCOUNTER — Other Ambulatory Visit: Payer: Self-pay | Admitting: Women's Health

## 2014-10-14 ENCOUNTER — Ambulatory Visit (INDEPENDENT_AMBULATORY_CARE_PROVIDER_SITE_OTHER): Payer: BLUE CROSS/BLUE SHIELD

## 2014-10-14 ENCOUNTER — Encounter: Payer: Self-pay | Admitting: Women's Health

## 2014-10-14 ENCOUNTER — Ambulatory Visit (INDEPENDENT_AMBULATORY_CARE_PROVIDER_SITE_OTHER): Payer: BLUE CROSS/BLUE SHIELD | Admitting: Women's Health

## 2014-10-14 VITALS — BP 118/72

## 2014-10-14 DIAGNOSIS — T8389XA Other specified complication of genitourinary prosthetic devices, implants and grafts, initial encounter: Secondary | ICD-10-CM

## 2014-10-14 DIAGNOSIS — Z975 Presence of (intrauterine) contraceptive device: Secondary | ICD-10-CM

## 2014-10-14 DIAGNOSIS — N921 Excessive and frequent menstruation with irregular cycle: Secondary | ICD-10-CM

## 2014-10-14 DIAGNOSIS — N923 Ovulation bleeding: Secondary | ICD-10-CM

## 2014-10-14 DIAGNOSIS — T8383XA Hemorrhage of genitourinary prosthetic devices, implants and grafts, initial encounter: Secondary | ICD-10-CM

## 2014-10-14 DIAGNOSIS — IMO0001 Reserved for inherently not codable concepts without codable children: Secondary | ICD-10-CM

## 2014-10-14 DIAGNOSIS — N8300 Follicular cyst of ovary, unspecified side: Secondary | ICD-10-CM

## 2014-10-14 DIAGNOSIS — Z30432 Encounter for removal of intrauterine contraceptive device: Secondary | ICD-10-CM

## 2014-10-14 DIAGNOSIS — N926 Irregular menstruation, unspecified: Secondary | ICD-10-CM

## 2014-10-14 NOTE — Progress Notes (Signed)
Patient ID: Cynthia Trevino, female   DOB: 06/28/1971, 44 y.o.   MRN: [id number] Presents for ultrasound to evaluate IUD. At recent annual exam reported some problems with irregular bleeding and unusual pelvic discomfort. Mirena IUD placed 09/2011.  Not sexually active.  Exam: Appears well Ultrasound: Endometrium 2.3. Right arm of IUD not seen, left arm lower uterine segment, IUD appears to be displaced. Left ovary has a follicle. No apparent adnexal masses.  Displaced Mirena IUD  Plan: IUD grasped with ring forcep removed, shown to patient and discarded. Instructed to call if discomfort persists and if cycles do not regulate. Reviewed it may take several months for cycle regulation. Condoms encouraged if sexually active or return to office for contraception management.

## 2014-10-18 ENCOUNTER — Telehealth: Payer: Self-pay | Admitting: *Deleted

## 2014-10-18 MED ORDER — IBUPROFEN 800 MG PO TABS: 800.0000 mg | ORAL_TABLET | Freq: Three times a day (TID) | ORAL | Status: DC | PRN

## 2014-10-18 NOTE — Telephone Encounter (Signed)
Pt informed, Rx sent. 

## 2014-10-18 NOTE — Telephone Encounter (Signed)
(  You are back up MD) pt had Mirena IUD removed on 10/14/14 had some spotting/cramping after removal, then on Saturday/Sunday she had heavy bleeding. Spotting now still having cramping, taking OTC motrin and no relief. Pt would just like advice if this whole process is normal with spotting/cramping? Please advise

## 2014-10-18 NOTE — Telephone Encounter (Signed)
Not unusual. As long as no fever or chills would recommend prescription strength ibuprofen 800 mg every 8 hour when necessary #30. Office visit if cramping continues

## 2015-08-01 ENCOUNTER — Other Ambulatory Visit: Payer: Self-pay

## 2015-08-01 DIAGNOSIS — Z1231 Encounter for screening mammogram for malignant neoplasm of breast: Secondary | ICD-10-CM

## 2015-08-19 ENCOUNTER — Ambulatory Visit: Payer: No Typology Code available for payment source

## 2015-09-06 ENCOUNTER — Ambulatory Visit: Payer: No Typology Code available for payment source

## 2015-09-14 ENCOUNTER — Ambulatory Visit
Admission: RE | Admit: 2015-09-14 | Discharge: 2015-09-14 | Disposition: A | Discharge status: Home / Self Care | Payer: BLUE CROSS/BLUE SHIELD | Source: Ambulatory Visit

## 2015-09-14 DIAGNOSIS — Z1231 Encounter for screening mammogram for malignant neoplasm of breast: Secondary | ICD-10-CM

## 2015-10-05 ENCOUNTER — Ambulatory Visit (INDEPENDENT_AMBULATORY_CARE_PROVIDER_SITE_OTHER): Payer: BLUE CROSS/BLUE SHIELD | Admitting: Women's Health

## 2015-10-05 ENCOUNTER — Encounter: Payer: Self-pay | Admitting: Women's Health

## 2015-10-05 ENCOUNTER — Other Ambulatory Visit (HOSPITAL_COMMUNITY)
Admission: RE | Admit: 2015-10-05 | Discharge: 2015-10-05 | Disposition: A | Discharge status: Home / Self Care | Payer: BLUE CROSS/BLUE SHIELD | Source: Ambulatory Visit | Attending: Women's Health | Admitting: Women's Health

## 2015-10-05 VITALS — BP 134/80 | Ht 65.0 in | Wt 170.0 lb

## 2015-10-05 DIAGNOSIS — Z1151 Encounter for screening for human papillomavirus (HPV): Secondary | ICD-10-CM | POA: Insufficient documentation

## 2015-10-05 DIAGNOSIS — Z01419 Encounter for gynecological examination (general) (routine) without abnormal findings: Secondary | ICD-10-CM | POA: Insufficient documentation

## 2015-10-05 DIAGNOSIS — Z13220 Encounter for screening for lipoid disorders: Secondary | ICD-10-CM

## 2015-10-05 LAB — LIPID PANEL
CHOLESTEROL: 202 mg/dL — AB (ref 125–200)
HDL: 50 mg/dL (ref >=46)
LDL Cholesterol: 131 mg/dL — ABNORMAL HIGH (ref <130)
TRIGLYCERIDES: 105 mg/dL (ref <150)
Total CHOL/HDL Ratio: 4 Ratio (ref <=5.0)
VLDL: 21 mg/dL (ref <30)

## 2015-10-05 LAB — CBC WITH DIFFERENTIAL/PLATELET
BASOS PCT: 1 % (ref 0–1)
Basophils Absolute: 0.1 10*3/uL (ref 0.0–0.1)
EOS ABS: 0.2 10*3/uL (ref 0.0–0.7)
Eosinophils Relative: 2 % (ref 0–5)
HCT: 39.4 % (ref 36.0–46.0)
HEMOGLOBIN: 13.4 g/dL (ref 12.0–15.0)
LYMPHS PCT: 35 % (ref 12–46)
Lymphs Abs: 2.8 10*3/uL (ref 0.7–4.0)
MCH: 31.4 pg (ref 26.0–34.0)
MCHC: 34 g/dL (ref 30.0–36.0)
MCV: 92.3 fL (ref 78.0–100.0)
MONO ABS: 0.6 10*3/uL (ref 0.1–1.0)
MPV: 9.1 fL (ref 8.6–12.4)
Monocytes Relative: 7 % (ref 3–12)
NEUTROS PCT: 55 % (ref 43–77)
Neutro Abs: 4.5 10*3/uL (ref 1.7–7.7)
PLATELETS: 306 10*3/uL (ref 150–400)
RBC: 4.27 MIL/uL (ref 3.87–5.11)
RDW: 13 % (ref 11.5–15.5)
WBC: 8.1 10*3/uL (ref 4.0–10.5)

## 2015-10-05 LAB — COMPREHENSIVE METABOLIC PANEL
ALBUMIN: 4.4 g/dL (ref 3.6–5.1)
ALT: 12 U/L (ref 6–29)
AST: 13 U/L (ref 10–35)
Alkaline Phosphatase: 39 U/L (ref 33–115)
BUN: 11 mg/dL (ref 7–25)
CO2: 24 mmol/L (ref 20–31)
CREATININE: 0.81 mg/dL (ref 0.50–1.10)
Calcium: 9.7 mg/dL (ref 8.6–10.2)
Chloride: 102 mmol/L (ref 98–110)
Glucose, Bld: 97 mg/dL (ref 65–99)
Potassium: 4 mmol/L (ref 3.5–5.3)
SODIUM: 135 mmol/L (ref 135–146)
TOTAL PROTEIN: 7.1 g/dL (ref 6.1–8.1)
Total Bilirubin: 0.4 mg/dL (ref 0.2–1.2)

## 2015-10-05 NOTE — Progress Notes (Signed)
Cynthia Trevino June 21, 1971 [id number]    History:    Presents for annual exam.  Monthly cycle/not sexually active. Divorce will be final in September, negative STD screen. 1996 LEEP with normal Paps after. Normal mammogram history. Father colon cancer age 47 has not had a screening colonoscopy.  Past medical history, past surgical history, family history and social history were all reviewed and documented in the EPIC chart. Works for an [employer], [employer]. Son 74, daughter 44 both doing well both received gardasil.  ROS:  A ROS was performed and pertinent positives and negatives are included.  Exam:  Filed Vitals:   10/05/15 1153  BP: 134/80    General appearance:  Normal Thyroid:  Symmetrical, normal in size, without palpable masses or nodularity. Respiratory  Auscultation:  Clear without wheezing or rhonchi Cardiovascular  Auscultation:  Regular rate, without rubs, murmurs or gallops  Edema/varicosities:  Not grossly evident Abdominal  Soft,nontender, without masses, guarding or rebound.  Liver/spleen:  No organomegaly noted  Hernia:  None appreciated  Skin  Inspection:  Grossly normal   Breasts: Examined lying and sitting.     Right: Without masses, retractions, discharge or axillary adenopathy.     Left: Without masses, retractions, discharge or axillary adenopathy. Gentitourinary   Inguinal/mons:  Normal without inguinal adenopathy  External genitalia:  Normal  BUS/Urethra/Skene's glands:  Normal  Vagina:  Normal  Cervix:  Normal  Uterus:   normal in size, shape and contour.  Midline and mobile  Adnexa/parametria:     Rt: Without masses or tenderness.   Lt: Without masses or tenderness.  Anus and perineum: Normal  Digital rectal exam: Normal sphincter tone without palpated masses or tenderness  Assessment/Plan:  45 y.o. D WF G2 P2 for annual exam with no complaints.  Monthly cycle/not sexually active 1996 LEEP normal Paps after  Plan: Reviewed importance of  scheduling screening colonoscopy, father colon cancer history. SBE's, annual screening mammogram, 3-D tomography reviewed and encouraged history of dense breast. Regular exercise, calcium rich diet, vitamin D 1000 daily encouraged. CBC, CMP, lipid panel, UA, Pap with HR HPV typing, new screening guidelines reviewed.  Harrington Challenger [facility], 12:49 PM 10/05/2015

## 2015-10-05 NOTE — Patient Instructions (Signed)
Health Maintenance, Female Adopting a healthy lifestyle and getting preventive care can go a long way to promote health and wellness. Talk with your health care provider about what schedule of regular examinations is right for you. This is a good chance for you to check in with your provider about disease prevention and staying healthy. In between checkups, there are plenty of things you can do on your own. Experts have done a lot of research about which lifestyle changes and preventive measures are most likely to keep you healthy. Ask your health care provider for more information. WEIGHT AND DIET  Eat a healthy diet  Be sure to include plenty of vegetables, fruits, low-fat dairy products, and lean protein.  Do not eat a lot of foods high in solid fats, added sugars, or salt.  Get regular exercise. This is one of the most important things you can do for your health.  Most adults should exercise for at least 150 minutes each week. The exercise should increase your heart rate and make you sweat (moderate-intensity exercise).  Most adults should also do strengthening exercises at least twice a week. This is in addition to the moderate-intensity exercise.  Maintain a healthy weight  Body mass index (BMI) is a measurement that can be used to identify possible weight problems. It estimates body fat based on height and weight. Your health care provider can help determine your BMI and help you achieve or maintain a healthy weight.  For females 20 years of age and older:   A BMI below 18.5 is considered underweight.  A BMI of 18.5 to 24.9 is normal.  A BMI of 25 to 29.9 is considered overweight.  A BMI of 30 and above is considered obese.  Watch levels of cholesterol and blood lipids  You should start having your blood tested for lipids and cholesterol at 47 years of age, then have this test every 5 years.  You may need to have your cholesterol levels checked more often if:  Your lipid  or cholesterol levels are high.  You are older than 47 years of age.  You are at high risk for heart disease.  CANCER SCREENING   Lung Cancer  Lung cancer screening is recommended for adults 55-80 years old who are at high risk for lung cancer because of a history of smoking.  A yearly low-dose CT scan of the lungs is recommended for people who:  Currently smoke.  Have quit within the past 15 years.  Have at least a 30-pack-year history of smoking. A pack year is smoking an average of one pack of cigarettes a day for 1 year.  Yearly screening should continue until it has been 15 years since you quit.  Yearly screening should stop if you develop a health problem that would prevent you from having lung cancer treatment.  Breast Cancer  Practice breast self-awareness. This means understanding how your breasts normally appear and feel.  It also means doing regular breast self-exams. Let your health care provider know about any changes, no matter how small.  If you are in your 20s or 30s, you should have a clinical breast exam (CBE) by a health care provider every 1-3 years as part of a regular health exam.  If you are 40 or older, have a CBE every year. Also consider having a breast X-ray (mammogram) every year.  If you have a family history of breast cancer, talk to your health care provider about genetic screening.  If you   are at high risk for breast cancer, talk to your health care provider about having an MRI and a mammogram every year.  Breast cancer gene (BRCA) assessment is recommended for women who have family members with BRCA-related cancers. BRCA-related cancers include:  Breast.  Ovarian.  Tubal.  Peritoneal cancers.  Results of the assessment will determine the need for genetic counseling and BRCA1 and BRCA2 testing. Cervical Cancer Your health care provider may recommend that you be screened regularly for cancer of the pelvic organs (ovaries, uterus, and  vagina). This screening involves a pelvic examination, including checking for microscopic changes to the surface of your cervix (Pap test). You may be encouraged to have this screening done every 3 years, beginning at age 21.  For women ages 30-65, health care providers may recommend pelvic exams and Pap testing every 3 years, or they may recommend the Pap and pelvic exam, combined with testing for human papilloma virus (HPV), every 5 years. Some types of HPV increase your risk of cervical cancer. Testing for HPV may also be done on women of any age with unclear Pap test results.  Other health care providers may not recommend any screening for nonpregnant women who are considered low risk for pelvic cancer and who do not have symptoms. Ask your health care provider if a screening pelvic exam is right for you.  If you have had past treatment for cervical cancer or a condition that could lead to cancer, you need Pap tests and screening for cancer for at least 20 years after your treatment. If Pap tests have been discontinued, your risk factors (such as having a new sexual partner) need to be reassessed to determine if screening should resume. Some women have medical problems that increase the chance of getting cervical cancer. In these cases, your health care provider may recommend more frequent screening and Pap tests. Colorectal Cancer  This type of cancer can be detected and often prevented.  Routine colorectal cancer screening usually begins at 47 years of age and continues through 47 years of age.  Your health care provider may recommend screening at an earlier age if you have risk factors for colon cancer.  Your health care provider may also recommend using home test kits to check for hidden blood in the stool.  A small camera at the end of a tube can be used to examine your colon directly (sigmoidoscopy or colonoscopy). This is done to check for the earliest forms of colorectal  cancer.  Routine screening usually begins at age 50.  Direct examination of the colon should be repeated every 5-10 years through 47 years of age. However, you may need to be screened more often if early forms of precancerous polyps or small growths are found. Skin Cancer  Check your skin from head to toe regularly.  Tell your health care provider about any new moles or changes in moles, especially if there is a change in a mole's shape or color.  Also tell your health care provider if you have a mole that is larger than the size of a pencil eraser.  Always use sunscreen. Apply sunscreen liberally and repeatedly throughout the day.  Protect yourself by wearing long sleeves, pants, a wide-brimmed hat, and sunglasses whenever you are outside. HEART DISEASE, DIABETES, AND HIGH BLOOD PRESSURE   High blood pressure causes heart disease and increases the risk of stroke. High blood pressure is more likely to develop in:  People who have blood pressure in the high end   of the normal range (130-139/85-89 mm Hg).  People who are overweight or obese.  People who are African American.  If you are 38-23 years of age, have your blood pressure checked every 3-5 years. If you are 61 years of age or older, have your blood pressure checked every year. You should have your blood pressure measured twice--once when you are at a hospital or clinic, and once when you are not at a hospital or clinic. Record the average of the two measurements. To check your blood pressure when you are not at a hospital or clinic, you can use:  An automated blood pressure machine at a pharmacy.  A home blood pressure monitor.  If you are between 45 years and 39 years old, ask your health care provider if you should take aspirin to prevent strokes.  Have regular diabetes screenings. This involves taking a blood sample to check your fasting blood sugar level.  If you are at a normal weight and have a low risk for diabetes,  have this test once every three years after 47 years of age.  If you are overweight and have a high risk for diabetes, consider being tested at a younger age or more often. PREVENTING INFECTION  Hepatitis B  If you have a higher risk for hepatitis B, you should be screened for this virus. You are considered at high risk for hepatitis B if:  You were born in a country where hepatitis B is common. Ask your health care provider which countries are considered high risk.  Your parents were born in a high-risk country, and you have not been immunized against hepatitis B (hepatitis B vaccine).  You have HIV or AIDS.  You use needles to inject street drugs.  You live with someone who has hepatitis B.  You have had sex with someone who has hepatitis B.  You get hemodialysis treatment.  You take certain medicines for conditions, including cancer, organ transplantation, and autoimmune conditions. Hepatitis C  Blood testing is recommended for:  Everyone born from [birth year] through 1967.  Anyone with known risk factors for hepatitis C. Sexually transmitted infections (STIs)  You should be screened for sexually transmitted infections (STIs) including gonorrhea and chlamydia if:  You are sexually active and are younger than 47 years of age.  You are older than 47 years of age and your health care provider tells you that you are at risk for this type of infection.  Your sexual activity has changed since you were last screened and you are at an increased risk for chlamydia or gonorrhea. Ask your health care provider if you are at risk.  If you do not have HIV, but are at risk, it may be recommended that you take a prescription medicine daily to prevent HIV infection. This is called pre-exposure prophylaxis (PrEP). You are considered at risk if:  You are sexually active and do not regularly use condoms or know the HIV status of your partner(s).  You take drugs by injection.  You are sexually  active with a partner who has HIV. Talk with your health care provider about whether you are at high risk of being infected with HIV. If you choose to begin PrEP, you should first be tested for HIV. You should then be tested every 3 months for as long as you are taking PrEP.  PREGNANCY   If you are premenopausal and you may become pregnant, ask your health care provider about preconception counseling.  If you may  become pregnant, take 400 to 800 micrograms (mcg) of folic acid every day.  If you want to prevent pregnancy, talk to your health care provider about birth control (contraception). OSTEOPOROSIS AND MENOPAUSE   Osteoporosis is a disease in which the bones lose minerals and strength with aging. This can result in serious bone fractures. Your risk for osteoporosis can be identified using a bone density scan.  If you are 61 years of age or older, or if you are at risk for osteoporosis and fractures, ask your health care provider if you should be screened.  Ask your health care provider whether you should take a calcium or vitamin D supplement to lower your risk for osteoporosis.  Menopause may have certain physical symptoms and risks.  Hormone replacement therapy may reduce some of these symptoms and risks. Talk to your health care provider about whether hormone replacement therapy is right for you.  HOME CARE INSTRUCTIONS   Schedule regular health, dental, and eye exams.  Stay current with your immunizations.   Do not use any tobacco products including cigarettes, chewing tobacco, or electronic cigarettes.  If you are pregnant, do not drink alcohol.  If you are breastfeeding, limit how much and how often you drink alcohol.  Limit alcohol intake to no more than 1 drink per day for nonpregnant women. One drink equals 12 ounces of beer, 5 ounces of wine, or 1 ounces of hard liquor.  Do not use street drugs.  Do not share needles.  Ask your health care provider for help if  you need support or information about quitting drugs.  Tell your health care provider if you often feel depressed.  Tell your health care provider if you have ever been abused or do not feel safe at home.   This information is not intended to replace advice given to you by your health care provider. Make sure you discuss any questions you have with your health care provider.   Document Released: 02/19/2011 Document Revised: 08/27/2014 Document Reviewed: 07/08/2013 Elsevier Interactive Patient Education [employer].

## 2015-10-06 ENCOUNTER — Encounter: Payer: Self-pay | Admitting: Women's Health

## 2015-10-06 LAB — URINALYSIS W MICROSCOPIC + REFLEX CULTURE
Bacteria, UA: NONE SEEN [HPF]
Bilirubin Urine: NEGATIVE
CASTS: NONE SEEN [LPF]
CRYSTALS: NONE SEEN [HPF]
Glucose, UA: NEGATIVE
Hgb urine dipstick: NEGATIVE
Ketones, ur: NEGATIVE
Leukocytes, UA: NEGATIVE
Nitrite: NEGATIVE
Protein, ur: NEGATIVE
RBC / HPF: NONE SEEN RBC/HPF (ref <=2)
SPECIFIC GRAVITY, URINE: 1.013 (ref 1.001–1.035)
YEAST: NONE SEEN [HPF]
pH: 5.5 (ref 5.0–8.0)

## 2015-10-07 LAB — CYTOLOGY - PAP

## 2015-10-08 LAB — URINE CULTURE

## 2016-07-17 ENCOUNTER — Ambulatory Visit (INDEPENDENT_AMBULATORY_CARE_PROVIDER_SITE_OTHER): Payer: 59 | Admitting: Women's Health

## 2016-07-17 ENCOUNTER — Encounter: Payer: Self-pay | Admitting: Women's Health

## 2016-07-17 VITALS — BP 124/80 | Ht 65.0 in | Wt 170.0 lb

## 2016-07-17 DIAGNOSIS — N938 Other specified abnormal uterine and vaginal bleeding: Secondary | ICD-10-CM

## 2016-07-17 DIAGNOSIS — N920 Excessive and frequent menstruation with regular cycle: Secondary | ICD-10-CM

## 2016-07-17 LAB — PREGNANCY, URINE: Preg Test, Ur: NEGATIVE

## 2016-07-17 MED ORDER — MEGESTROL ACETATE 40 MG PO TABS: 40.0000 mg | ORAL_TABLET | Freq: Two times a day (BID) | ORAL | 0 refills | Status: DC

## 2016-07-17 NOTE — Progress Notes (Signed)
Presents for heavy, vaginal menstrual bleeding since cycle  11/14. Changing a pad every 1-2  hours with large clots. Last month her cycle lasted 2 weeks and spontaneously stopped. Prior to this her cycle usually lasts 3-4 days. Not sexually active > one year. Denies urinary symptoms, abdominal pain, fever, or chills. No personal or family history of  fibroids, or anemia. Had history of uterine polyps 15-20 years ago. Father with colon cancer diagnosed in his 8050s. Pt has not had screening colonoscopy.  Exam: Appears well, mild rosacea. External genitalia: Bright red blood noticed externally Speculum exam: Copious menses blood present, cervix clean no visible polyps. UPT: negative  Dysfunctional Uterine Bleeding Menorrhagia with regular cycle  Plan: Start Megace 40 mg twice a day until Sonohystogram can be [place] with Dr. Audie BoxFontaine. I Instructed to call if bleeding does not stop or slow  in the next 2-3 days. [facility] information given instructed to schedule screening colonoscopy

## 2016-07-17 NOTE — Patient Instructions (Signed)
Dysfunctional Uterine Bleeding Introduction Dysfunctional uterine bleeding is abnormal bleeding from the uterus. Dysfunctional uterine bleeding includes:  A period that comes earlier or later than usual.  A period that is lighter, heavier, or has blood clots.  Bleeding between periods.  Skipping one or more periods.  Bleeding after sexual intercourse.  Bleeding after menopause. Follow these instructions at home: Pay attention to any changes in your symptoms. Follow these instructions to help with your condition: Eating and drinking  Eat well-balanced meals. Include foods that are high in iron, such as liver, meat, shellfish, green leafy vegetables, and eggs.  If you become constipated:  Drink plenty of water.  Eat fruits and vegetables that are high in water and fiber, such as spinach, carrots, raspberries, apples, and mango. Medicines  Take over-the-counter and prescription medicines only as told by your health care provider.  Do not change medicines without talking with your health care provider.  Aspirin or medicines that contain aspirin may make the bleeding worse. Do not take those medicines:  During the week before your period.  During your period.  If you were prescribed iron pills, take them as told by your health care provider. Iron pills help to replace iron that your body loses because of this condition. Activity  If you need to change your sanitary pad or tampon more than one time every 2 hours:  Lie in bed with your feet raised (elevated).  Place a cold pack on your lower abdomen.  Rest as much as possible until the bleeding stops or slows down.  Do not try to lose weight until the bleeding has stopped and your blood iron level is back to normal. Other Instructions  For two months, write down:  When your period starts.  When your period ends.  When any abnormal bleeding occurs.  What problems you notice.  Keep all follow up visits as told by  your health care provider. This is important. Contact a health care provider if:  You get light-headed or weak.  You have nausea and vomiting.  You cannot eat or drink without vomiting.  You feel dizzy or have diarrhea while you are taking medicines.  You are taking birth control pills or hormones, and you want to change them or stop taking them. Get help right away if:  You develop a fever or chills.  You need to change your sanitary pad or tampon more than one time per hour.  Your bleeding becomes heavier, or your flow contains clots more often.  You develop pain in your abdomen.  You lose consciousness.  You develop a rash. This information is not intended to replace advice given to you by your health care provider. Make sure you discuss any questions you have with your health care provider. Document Released: 08/03/2000 Document Revised: 01/12/2016 Document Reviewed: 11/01/2014  2017 Elsevier Place abnormal uterine bleeding patient instructions here.

## 2016-07-18 ENCOUNTER — Other Ambulatory Visit: Payer: Self-pay | Admitting: Gynecology

## 2016-07-18 DIAGNOSIS — N939 Abnormal uterine and vaginal bleeding, unspecified: Secondary | ICD-10-CM

## 2016-07-18 DIAGNOSIS — N938 Other specified abnormal uterine and vaginal bleeding: Secondary | ICD-10-CM

## 2016-07-18 DIAGNOSIS — N920 Excessive and frequent menstruation with regular cycle: Secondary | ICD-10-CM

## 2016-07-20 ENCOUNTER — Telehealth: Payer: Self-pay | Admitting: *Deleted

## 2016-07-20 NOTE — Telephone Encounter (Signed)
Per Vince at [facility] call [phone]  [facility] codes 58340/58100 covered at 80% and codes 76831/76857 covered 100% once deductible of $4000 met only $139.58 met.  Therefore pt responsible for $1168.29. Pt will be informed KW CMA

## 2016-08-01 ENCOUNTER — Encounter: Payer: Self-pay | Admitting: Gynecology

## 2016-08-01 ENCOUNTER — Ambulatory Visit (INDEPENDENT_AMBULATORY_CARE_PROVIDER_SITE_OTHER): Payer: 59 | Admitting: Gynecology

## 2016-08-01 ENCOUNTER — Ambulatory Visit (INDEPENDENT_AMBULATORY_CARE_PROVIDER_SITE_OTHER): Payer: 59

## 2016-08-01 ENCOUNTER — Other Ambulatory Visit: Payer: Self-pay | Admitting: Gynecology

## 2016-08-01 VITALS — BP 118/74

## 2016-08-01 DIAGNOSIS — N9489 Other specified conditions associated with female genital organs and menstrual cycle: Secondary | ICD-10-CM

## 2016-08-01 DIAGNOSIS — N938 Other specified abnormal uterine and vaginal bleeding: Secondary | ICD-10-CM | POA: Diagnosis not present

## 2016-08-01 DIAGNOSIS — R938 Abnormal findings on diagnostic imaging of other specified body structures: Secondary | ICD-10-CM

## 2016-08-01 DIAGNOSIS — N920 Excessive and frequent menstruation with regular cycle: Secondary | ICD-10-CM

## 2016-08-01 DIAGNOSIS — N939 Abnormal uterine and vaginal bleeding, unspecified: Secondary | ICD-10-CM

## 2016-08-01 DIAGNOSIS — R9389 Abnormal findings on diagnostic imaging of other specified body structures: Secondary | ICD-10-CM

## 2016-08-01 DIAGNOSIS — N840 Polyp of corpus uteri: Secondary | ICD-10-CM

## 2016-08-01 DIAGNOSIS — N926 Irregular menstruation, unspecified: Secondary | ICD-10-CM

## 2016-08-01 NOTE — Progress Notes (Signed)
    Cynthia Trevino 04/14/1971 [id number]        45 y.o.  G2P2 presents for sonohysterogram. History of regular menses through end of October when she started to bleed irregularly up to 2 weeks straight. Started bleeding heavily and was seen by Harriett SineNancy end of November and started on Megace which has quieted her bleeding. Currently not sexually active.  Past medical history,surgical history, problem list, medications, allergies, family history and social history were all reviewed and documented in the EPIC chart.  Directed ROS with pertinent positives and negatives documented in the history of present illness/assessment and plan.  Exam: [place] assistant Vitals:   08/01/16 1241  BP: 118/74   General appearance:  Normal Abdomen soft nontender without masses guarding rebound Pelvic external BUS vagina normal. Cervix normal. Uterus normal size midline mobile nontender. Adnexa without masses or tenderness.  Ultrasound transvaginal and transabdominal shows anteverted uterus with normal myometrial echotexture. Endometrial echo 34.8 mm with focal appearing mass 39 x 33 mm with positive color flow Doppler extending into the myometrium. Right and left ovaries normal. Cul-de-sac negative.  Sonohysterogram performed, sterile technique, easy catheter introduction, good distention with 38 x 31 x 58 mm lobulated anterior wall defect. Endometrial sample taken. Patient tolerated well.  Assessment/Plan:  45 y.o. G2P2 with irregular bleeding and sonohysterogram consistent with large polyp.  Reviewed situation with the patient and my recommendation to proceed with hysteroscopy D&C with resection of the endometrial polyp.   I reviewed the proposed surgery with the patient to include the expected intraoperative and postoperative courses as well as the recovery period. The use of the hysteroscope, resectoscope and the D&C portion were all discussed. The risks of surgery to include infection, prolonged antibiotics,  hemorrhage necessitating transfusion and the risks of transfusion, including transfusion reaction, hepatitis, HIV, mad cow disease and other unknown entities were all discussed understood and accepted. The risk of damage to internal organs during the procedure, either immediately recognized or delay recognized, including vagina, cervix, uterus, possible perforation causing damage to bowel, bladder, ureters, vessels and nerves necessitating major exploratory reparative surgery and future reparative surgeries including bladder repair, ureteral damage repair, bowel resection, ostomy formation was also discussed understood and accepted. The potential for distended media absorption leading to metabolic complications such as fluid overload, coma and seizures was also discussed understood and accepted. She understands there are no guarantees that this will relieve her irregular bleeding. The patient's questions were answered and she is ready to proceed with surgery and we will schedule this at her convenience.    [name],Juanmanuel Marohl P MD, 12:56 PM 08/01/2016

## 2016-08-01 NOTE — Patient Instructions (Signed)
Office will call you with the biopsy results from the ultrasound.  Office will call you to schedule your surgery D&C

## 2016-08-02 ENCOUNTER — Telehealth: Payer: Self-pay

## 2016-08-02 NOTE — Telephone Encounter (Signed)
I called patient to discuss scheduling surgery. We discussed her ins benefits and her estimated surgery prepayment due to GGA.  Patient is financially unable to schedule at this moment but wants to talk with her family and see if she can figure out something. She will call me when ready to schedule.  Dr. Velvet BatheF- Patient said you told her to refill Megace as she needed until she could have her surgery. Ok to refill?

## 2016-08-03 MED ORDER — MEGESTROL ACETATE 20 MG PO TABS: ORAL_TABLET | ORAL | 0 refills | Status: DC

## 2016-08-03 NOTE — Telephone Encounter (Signed)
Okay to refill Megace 20 mg now #30 one by mouth daily with bleeding no refill

## 2016-08-03 NOTE — Telephone Encounter (Signed)
Rx sent 

## 2016-08-28 ENCOUNTER — Telehealth: Payer: Self-pay

## 2016-08-28 MED ORDER — MISOPROSTOL 200 MCG PO TABS: ORAL_TABLET | ORAL | 0 refills | Status: DC

## 2016-08-28 NOTE — Telephone Encounter (Signed)
Patient called stating that she was ready to schedule her Hysteroscopy D&C for endo polyp ASAP as she is in "alot of pain".  I called her back to discuss pain since that is not why surgery is being done. I explained that pain not mentioned in her office note and not why surgery is being performed. She stated she had no period in Dec but began bleeding last Tuesday and has been bleeding heavy. She said her pain was so bad she was doubled up and "almost went to ER".  She started Megace yesterday and bleeding has improved and so has the pain. She did say that she thinks the pain is cramps associated with her heavy period. I advised her if the pain does not subside with the bleeding that she should come in this week to be assessed.  [facility] at 7:30am.  [facility] front desk will call her to schedule pre op appt. [facility] and financial letter will be mailed. We discussed ins benefits and her est surgery prepymt amt to GGA and due before one week of surgery.  I explained to her need for Cytotec tab hs before surgery. I explained it could cause some spotting/light bleeding or menstrual like cramping that night but no need for worry as that just means it is working.  Rx sent.

## 2016-08-28 NOTE — Telephone Encounter (Signed)
Encounter already opened. 

## 2016-09-03 ENCOUNTER — Telehealth: Payer: Self-pay | Admitting: *Deleted

## 2016-09-03 NOTE — Telephone Encounter (Signed)
First option would be to increase her Megace to 3 times daily assuming she is taking it twice daily now. Alternative would be moving up her surgery and scheduling sooner. If she is interested in this then [place] to [place].

## 2016-09-03 NOTE — Telephone Encounter (Signed)
Pt is scheduled for surgery on 09/17/16 D&C taking megace 20 mg daily still bleeding and cramps daily, bleeding heavy to light with clots. Pt said bleeding has not stopped. Please advise

## 2016-09-03 NOTE — Telephone Encounter (Signed)
I spoke with patient and moved her surgery earlier to 09/11/16 1:00pm [facility] which [facility] first [facility].

## 2016-09-03 NOTE — Telephone Encounter (Signed)
Pt informed with the below, would like to see if surgery could be moved up, will check with Olegario Messierkathy to see if this can be done.

## 2016-09-03 NOTE — Telephone Encounter (Signed)
Cynthia Trevino will call her and move pre op appt with TF up to earlier date.

## 2016-09-05 ENCOUNTER — Ambulatory Visit: Payer: 59 | Admitting: Gynecology

## 2016-09-06 ENCOUNTER — Encounter (HOSPITAL_BASED_OUTPATIENT_CLINIC_OR_DEPARTMENT_OTHER): Payer: Self-pay | Admitting: *Deleted

## 2016-09-06 NOTE — Progress Notes (Signed)
NPO AFTER MN W/ EXCEPTION CLEAR LIQUIDS UNTIL 0700 (NO CREAM / MILK PRODUCTS).  ARRIVE AT 1130.  NEEDS URINE PREG.  GETTING CBC DONE Monday 09-10-2016.

## 2016-09-07 ENCOUNTER — Ambulatory Visit (INDEPENDENT_AMBULATORY_CARE_PROVIDER_SITE_OTHER): Payer: 59 | Admitting: Gynecology

## 2016-09-07 ENCOUNTER — Encounter: Payer: Self-pay | Admitting: Gynecology

## 2016-09-07 VITALS — BP 120/74

## 2016-09-07 DIAGNOSIS — N926 Irregular menstruation, unspecified: Secondary | ICD-10-CM | POA: Diagnosis not present

## 2016-09-07 DIAGNOSIS — N840 Polyp of corpus uteri: Secondary | ICD-10-CM

## 2016-09-07 NOTE — H&P (Signed)
Cynthia Trevino 05/19/1971 [id number]   History and Physical  Chief complaint: Irregular bleeding, endometrial polyps  History of present illness: 46 y.o. G2P2 with history of regular menses through the end of October when she started to bleed on and off irregularly. Ultimately required Megace to control her bleeding. Ultrasound showed endometrial echo 34.8 mm with focally appearing mass with positive color flow within the endometrium. Sonohysterogram confirmed a lobulated anterior wall defect measuring 38 x 31 x 58 mm. Endometrial sample showed secretory endometrium. Patient is admitted for hysteroscopy D&C with resection of endometrial polyp  Past medical history,surgical history, medications, allergies, family history and social history were all reviewed and documented in the EPIC chart.  ROS:  Was performed and pertinent positives and negatives are included in the history of present illness.  Exam:  [name] assistant Vitals:   09/07/16 1456  BP: 120/74   General: well developed, well nourished female, no acute distress HEENT: normal  Lungs: clear to auscultation without wheezing, rales or rhonchi  Cardiac: regular rate without rubs, murmurs or gallops  Abdomen: soft, nontender without masses, guarding, rebound, organomegaly  Pelvic: external bus vagina: normal with staining Cervix: grossly normal  Uterus: normal size, midline and mobile, nontender  Adnexa: without masses or tenderness    Assessment/Plan:  46 y.o. G2P2 with irregular bleeding and ultrasound consistent with endometrial polyp. Biopsy showed secretory endometrium. Patient is been it for hysteroscopy D&C with resection of her endometrial polyps. I again reviewed the proposed surgery with the patient to include the expected intraoperative and postoperative courses as well as the recovery period. The use of the hysteroscope, resectoscope and the D&C portion were all discussed. The risks of surgery to include infection,  prolonged antibiotics, hemorrhage necessitating transfusion and the risks of transfusion, including transfusion reaction, hepatitis, HIV, mad cow disease and other unknown entities were all discussed understood and accepted. The risk of damage to internal organs during the procedure, either immediately recognized or delay recognized, including vagina, cervix, uterus, possible perforation causing damage to bowel, bladder, ureters, vessels and nerves necessitating major exploratory reparative surgery and future reparative surgeries including bladder repair, ureteral damage repair, bowel resection, ostomy formation was also discussed understood and accepted. The potential for distended media absorption leading to metabolic complications such as fluid overload, coma and seizures was also discussed understood and accepted. She understands there are no guarantees that this will relieve her irregular bleeding. The patient's questions were answered and she is ready to proceed with surgery.     [name],TIMOTHY P MD, 3:19 PM 09/07/2016

## 2016-09-07 NOTE — Patient Instructions (Addendum)
Followup for surgery as scheduled. 

## 2016-09-07 NOTE — Progress Notes (Signed)
Cynthia Trevino 09/23/1970 [id number]   Preoperative consult  Chief complaint: Irregular bleeding, endometrial polyps  History of present illness: 46 y.o. G2P2 with history of regular menses through the end of October when she started to bleed on and off irregularly. Ultimately required Megace to control her bleeding. Ultrasound showed endometrial echo 34.8 mm with focally appearing mass with positive color flow within the endometrium. Sonohysterogram confirmed a lobulated anterior wall defect measuring 38 x 31 x 58 mm. Endometrial sample showed secretory endometrium. Patient is admitted for hysteroscopy D&C with resection of endometrial polyp  Past medical history,surgical history, medications, allergies, family history and social history were all reviewed and documented in the EPIC chart.  ROS:  Was performed and pertinent positives and negatives are included in the history of present illness.  Exam:  [name] assistant Vitals:   09/07/16 1456  BP: 120/74   General: well developed, well nourished female, no acute distress HEENT: normal  Lungs: clear to auscultation without wheezing, rales or rhonchi  Cardiac: regular rate without rubs, murmurs or gallops  Abdomen: soft, nontender without masses, guarding, rebound, organomegaly  Pelvic: external bus vagina: normal with staining Cervix: grossly normal  Uterus: normal size, midline and mobile, nontender  Adnexa: without masses or tenderness    Assessment/Plan:  46 y.o. G2P2 with irregular bleeding and ultrasound consistent with endometrial polyp. Biopsy showed secretory endometrium. Patient is been it for hysteroscopy D&C with resection of her endometrial polyps. I again reviewed the proposed surgery with the patient to include the expected intraoperative and postoperative courses as well as the recovery period. The use of the hysteroscope, resectoscope and the D&C portion were all discussed. The risks of surgery to include infection,  prolonged antibiotics, hemorrhage necessitating transfusion and the risks of transfusion, including transfusion reaction, hepatitis, HIV, mad cow disease and other unknown entities were all discussed understood and accepted. The risk of damage to internal organs during the procedure, either immediately recognized or delay recognized, including vagina, cervix, uterus, possible perforation causing damage to bowel, bladder, ureters, vessels and nerves necessitating major exploratory reparative surgery and future reparative surgeries including bladder repair, ureteral damage repair, bowel resection, ostomy formation was also discussed understood and accepted. The potential for distended media absorption leading to metabolic complications such as fluid overload, coma and seizures was also discussed understood and accepted. She understands there are no guarantees that this will relieve her irregular bleeding. The patient's questions were answered and she is ready to proceed with surgery.      [name],[name] MD, 3:13 PM 09/07/2016

## 2016-09-10 ENCOUNTER — Other Ambulatory Visit (HOSPITAL_COMMUNITY)
Admission: RE | Admit: 2016-09-10 | Discharge: 2016-09-10 | Disposition: A | Discharge status: Home / Self Care | Payer: 59 | Source: Ambulatory Visit | Attending: Gynecology | Admitting: Gynecology

## 2016-09-10 DIAGNOSIS — M1990 Unspecified osteoarthritis, unspecified site: Secondary | ICD-10-CM | POA: Diagnosis not present

## 2016-09-10 DIAGNOSIS — N840 Polyp of corpus uteri: Secondary | ICD-10-CM | POA: Diagnosis present

## 2016-09-10 LAB — CBC
HCT: 37.7 % (ref 36.0–46.0)
HEMOGLOBIN: 12.9 g/dL (ref 12.0–15.0)
MCH: 31.1 pg (ref 26.0–34.0)
MCHC: 34.2 g/dL (ref 30.0–36.0)
MCV: 90.8 fL (ref 78.0–100.0)
PLATELETS: 306 10*3/uL (ref 150–400)
RBC: 4.15 MIL/uL (ref 3.87–5.11)
RDW: 13 % (ref 11.5–15.5)
WBC: 7.9 10*3/uL (ref 4.0–10.5)

## 2016-09-11 ENCOUNTER — Encounter (HOSPITAL_BASED_OUTPATIENT_CLINIC_OR_DEPARTMENT_OTHER)
Admission: RE | Disposition: A | Discharge status: Home / Self Care | Payer: Self-pay | Source: Ambulatory Visit | Attending: Gynecology

## 2016-09-11 ENCOUNTER — Encounter (HOSPITAL_BASED_OUTPATIENT_CLINIC_OR_DEPARTMENT_OTHER): Payer: Self-pay | Admitting: Anesthesiology

## 2016-09-11 ENCOUNTER — Ambulatory Visit (HOSPITAL_BASED_OUTPATIENT_CLINIC_OR_DEPARTMENT_OTHER): Payer: 59 | Admitting: Certified Registered"

## 2016-09-11 ENCOUNTER — Ambulatory Visit (HOSPITAL_BASED_OUTPATIENT_CLINIC_OR_DEPARTMENT_OTHER)
Admission: RE | Admit: 2016-09-11 | Discharge: 2016-09-11 | Disposition: A | Discharge status: Home / Self Care | Payer: 59 | Source: Ambulatory Visit | Attending: Gynecology | Admitting: Gynecology

## 2016-09-11 DIAGNOSIS — N840 Polyp of corpus uteri: Secondary | ICD-10-CM | POA: Diagnosis not present

## 2016-09-11 DIAGNOSIS — N939 Abnormal uterine and vaginal bleeding, unspecified: Secondary | ICD-10-CM | POA: Diagnosis not present

## 2016-09-11 DIAGNOSIS — M1990 Unspecified osteoarthritis, unspecified site: Secondary | ICD-10-CM | POA: Insufficient documentation

## 2016-09-11 HISTORY — DX: Family history of other specified conditions: Z84.89

## 2016-09-11 HISTORY — DX: Unspecified osteoarthritis, unspecified site: M19.90

## 2016-09-11 HISTORY — PX: DILATATION & CURETTAGE/HYSTEROSCOPY WITH MYOSURE: SHX6511

## 2016-09-11 HISTORY — DX: Polyp of corpus uteri: N84.0

## 2016-09-11 LAB — POCT PREGNANCY, URINE: Preg Test, Ur: NEGATIVE

## 2016-09-11 SURGERY — DILATATION & CURETTAGE/HYSTEROSCOPY WITH MYOSURE
Anesthesia: General | Site: Uterus

## 2016-09-11 MED ORDER — MIDAZOLAM HCL 2 MG/2ML IJ SOLN
INTRAMUSCULAR | Status: AC
  Filled 2016-09-11: qty 2

## 2016-09-11 MED ORDER — SODIUM CHLORIDE 0.9 % IR SOLN
Status: DC | PRN
  Administered 2016-09-11: 3000 mL

## 2016-09-11 MED ORDER — FENTANYL CITRATE (PF) 100 MCG/2ML IJ SOLN
INTRAMUSCULAR | Status: AC
  Filled 2016-09-11: qty 2

## 2016-09-11 MED ORDER — DEXAMETHASONE SODIUM PHOSPHATE 10 MG/ML IJ SOLN
INTRAMUSCULAR | Status: AC
  Filled 2016-09-11: qty 1

## 2016-09-11 MED ORDER — LIDOCAINE HCL 1 % IJ SOLN
INTRAMUSCULAR | Status: DC | PRN
  Administered 2016-09-11: 10 mL

## 2016-09-11 MED ORDER — LACTATED RINGERS IV SOLN
INTRAVENOUS | Status: DC
  Administered 2016-09-11: 12:00:00 via INTRAVENOUS
  Filled 2016-09-11: qty 1000

## 2016-09-11 MED ORDER — OXYCODONE-ACETAMINOPHEN 5-325 MG PO TABS: 1.0000 | ORAL_TABLET | ORAL | 0 refills | Status: DC | PRN

## 2016-09-11 MED ORDER — ONDANSETRON HCL 4 MG/2ML IJ SOLN
INTRAMUSCULAR | Status: AC
  Filled 2016-09-11: qty 2

## 2016-09-11 MED ORDER — FENTANYL CITRATE (PF) 100 MCG/2ML IJ SOLN
25.0000 ug | INTRAMUSCULAR | Status: DC | PRN
  Filled 2016-09-11: qty 1

## 2016-09-11 MED ORDER — KETOROLAC TROMETHAMINE 30 MG/ML IJ SOLN
INTRAMUSCULAR | Status: AC
  Filled 2016-09-11: qty 1

## 2016-09-11 MED ORDER — PROPOFOL 10 MG/ML IV BOLUS
INTRAVENOUS | Status: DC | PRN
  Administered 2016-09-11: 200 mg via INTRAVENOUS

## 2016-09-11 MED ORDER — CIPROFLOXACIN IN D5W 400 MG/200ML IV SOLN
400.0000 mg | INTRAVENOUS | Status: AC
  Administered 2016-09-11: 400 mg via INTRAVENOUS
  Filled 2016-09-11: qty 200

## 2016-09-11 MED ORDER — CIPROFLOXACIN IN D5W 400 MG/200ML IV SOLN
INTRAVENOUS | Status: AC
Start: 2016-09-11 — End: 2016-09-11
  Filled 2016-09-11: qty 200

## 2016-09-11 MED ORDER — MIDAZOLAM HCL 5 MG/5ML IJ SOLN
INTRAMUSCULAR | Status: DC | PRN
  Administered 2016-09-11: 2 mg via INTRAVENOUS

## 2016-09-11 MED ORDER — CLINDAMYCIN PHOSPHATE 900 MG/50ML IV SOLN
900.0000 mg | INTRAVENOUS | Status: AC
  Administered 2016-09-11: 900 mg via INTRAVENOUS
  Filled 2016-09-11: qty 50

## 2016-09-11 MED ORDER — FENTANYL CITRATE (PF) 100 MCG/2ML IJ SOLN
INTRAMUSCULAR | Status: DC | PRN
  Administered 2016-09-11 (×2): 50 ug via INTRAVENOUS

## 2016-09-11 MED ORDER — OXYCODONE HCL 5 MG/5ML PO SOLN
5.0000 mg | Freq: Once | ORAL | Status: AC | PRN
  Filled 2016-09-11: qty 5

## 2016-09-11 MED ORDER — OXYCODONE HCL 5 MG PO TABS
ORAL_TABLET | ORAL | Status: AC
  Filled 2016-09-11: qty 1

## 2016-09-11 MED ORDER — DEXAMETHASONE SODIUM PHOSPHATE 4 MG/ML IJ SOLN
INTRAMUSCULAR | Status: DC | PRN
  Administered 2016-09-11: 10 mg via INTRAVENOUS

## 2016-09-11 MED ORDER — KETOROLAC TROMETHAMINE 30 MG/ML IJ SOLN
INTRAMUSCULAR | Status: DC | PRN
  Administered 2016-09-11: 30 mg via INTRAVENOUS

## 2016-09-11 MED ORDER — PROPOFOL 10 MG/ML IV BOLUS
INTRAVENOUS | Status: AC
  Filled 2016-09-11: qty 20

## 2016-09-11 MED ORDER — OXYCODONE HCL 5 MG PO TABS
5.0000 mg | ORAL_TABLET | Freq: Once | ORAL | Status: AC | PRN
  Administered 2016-09-11: 5 mg via ORAL
  Filled 2016-09-11: qty 1

## 2016-09-11 MED ORDER — LIDOCAINE 2% (20 MG/ML) 5 ML SYRINGE
INTRAMUSCULAR | Status: DC | PRN
  Administered 2016-09-11: 100 mg via INTRAVENOUS

## 2016-09-11 MED ORDER — CLINDAMYCIN PHOSPHATE 900 MG/50ML IV SOLN
INTRAVENOUS | Status: AC
  Filled 2016-09-11: qty 50

## 2016-09-11 MED ORDER — LIDOCAINE 2% (20 MG/ML) 5 ML SYRINGE
INTRAMUSCULAR | Status: AC
Start: 2016-09-11 — End: 2016-09-11
  Filled 2016-09-11: qty 5

## 2016-09-11 SURGICAL SUPPLY — 28 items
CANISTER SUCTION 2500CC (MISCELLANEOUS) ×6 IMPLANT
CATH ROBINSON RED A/P 16FR (CATHETERS) ×3 IMPLANT
COVER BACK TABLE 60X90IN (DRAPES) ×3 IMPLANT
DEVICE MYOSURE LITE (MISCELLANEOUS) ×3 IMPLANT
DRAPE HYSTEROSCOPY (DRAPE) ×3 IMPLANT
DRAPE LG THREE QUARTER DISP (DRAPES) ×3 IMPLANT
DRSG TELFA 3X8 NADH (GAUZE/BANDAGES/DRESSINGS) ×3 IMPLANT
FILTER ARTHROSCOPY CONVERTOR (FILTER) ×3 IMPLANT
GLOVE BIO SURGEON STRL SZ 6.5 (GLOVE) ×2 IMPLANT
GLOVE BIO SURGEON STRL SZ7.5 (GLOVE) ×6 IMPLANT
GLOVE BIO SURGEONS STRL SZ 6.5 (GLOVE) ×1
GLOVE INDICATOR 6.5 STRL GRN (GLOVE) ×3 IMPLANT
GOWN STRL REUS W/TWL LRG LVL3 (GOWN DISPOSABLE) ×3 IMPLANT
GOWN STRL REUS W/TWL XL LVL3 (GOWN DISPOSABLE) ×3 IMPLANT
KIT ROOM TURNOVER WOR (KITS) ×3 IMPLANT
LEGGING LITHOTOMY PAIR STRL (DRAPES) ×3 IMPLANT
NEEDLE SPNL 22GX3.5 QUINCKE BK (NEEDLE) ×3 IMPLANT
PACK BASIN DAY SURGERY FS (CUSTOM PROCEDURE TRAY) ×3 IMPLANT
PAD OB MATERNITY 4.3X12.25 (PERSONAL CARE ITEMS) ×3 IMPLANT
PAD PREP 24X48 CUFFED NSTRL (MISCELLANEOUS) ×3 IMPLANT
SEAL ROD LENS SCOPE MYOSURE (ABLATOR) ×3 IMPLANT
SYR CONTROL 10ML LL (SYRINGE) ×3 IMPLANT
SYRINGE LUER LOK 1CC (MISCELLANEOUS) ×3 IMPLANT
TOWEL OR 17X24 6PK STRL BLUE (TOWEL DISPOSABLE) ×6 IMPLANT
TRAY DSU PREP LF (CUSTOM PROCEDURE TRAY) ×3 IMPLANT
TUBING AQUILEX INFLOW (TUBING) ×3 IMPLANT
TUBING AQUILEX OUTFLOW (TUBING) ×3 IMPLANT
WATER STERILE IRR 500ML POUR (IV SOLUTION) ×3 IMPLANT

## 2016-09-11 NOTE — Anesthesia Postprocedure Evaluation (Addendum)
Anesthesia Post Note  Patient: Cynthia Trevino  Procedure(s) Performed: Procedure(s) (LRB): DILATATION & CURETTAGE/HYSTEROSCOPY WITH MYOSURE (N/A)  Patient location during evaluation: PACU Anesthesia Type: General Level of consciousness: awake and alert Pain management: pain level controlled Vital Signs Assessment: post-procedure vital signs reviewed and stable Respiratory status: spontaneous breathing, nonlabored ventilation, respiratory function stable and patient connected to nasal cannula oxygen Cardiovascular status: blood pressure returned to baseline and stable Postop Assessment: no signs of nausea or vomiting Anesthetic complications: no       Last Vitals:  Vitals:   09/11/16 1330 09/11/16 1345  BP: 116/78 117/84  Pulse: 79 84  Resp: 12 (!) 24  Temp: 36.8 C     Last Pain:  Vitals:   09/11/16 1345  TempSrc:   PainSc: 5                  Dalton Mille

## 2016-09-11 NOTE — H&P (Signed)
The patient was examined.  I reviewed the proposed surgery and consent form with the patient.  The dictated history and physical is current and accurate and all questions were answered. The patient is ready to proceed with surgery and has a realistic understanding and expectation for the outcome.   [name],Ameen Mostafa P MD, 12:40 PM 09/11/2016

## 2016-09-11 NOTE — Op Note (Signed)
Serita Gritracey A Probus 09/26/1970 [id number]   Post Operative Note   Date of surgery:  09/11/2016  Pre Op Dx:  Irregular bleeding, endometrial polyps  Post Op Dx:  Irregular bleeding, endometrial polyps  Procedure:  Hysteroscopy, D&C, Myosure resection endometrial polyps  Surgeon:  Colin BroachFONTAINE,Leanda Padmore P  Anesthesia:  General  EBL:  Minimal  Distended media discrepancy:  325 cc saline  Complications:  None  Specimen:  #1 endometrial curetting #2 endometrial polyps to pathology  Findings: EUA:  External BUS vagina with light menstrual bleeding. Cervix normal. Uterus normal size midline mobile. Adnexa without masses   Hysteroscopy:  Adequate noting right and left tubal ostia, fundus, anterior and posterior endometrial surfaces, lower uterine segment and endocervical canal all visualized. 3 small polyps from right to left posterior endometrial surfaces noted. All resected to the level the surrounding endometrium. Thicker anterior endometrial surface with small papillary carpeted pattern. Superficial area resected with the resectoscope and subsequently removed with the curettage.  Procedure:  The patient was taken to the operating room, was placed in the low dorsal lithotomy position, underwent general anesthesia, received a perineal/vaginal preparation with Betadine solution and the bladder was emptied with in and out Foley catheterization. The timeout was performed by the surgical team. An EUA was performed. The patient was draped in the usual fashion. The cervix was visualized with a speculum, anterior lip grasped with a single-tooth tenaculum and a paracervical block was placed using 10 cc's of 1% lidocaine. The cervix was gently dilated to admit the Myosure hysteroscope and hysteroscopy was performed with findings noted above. Using the Myosure Lite resectoscopic wand the polyps were resected in their entirety to the level the surrounding endometrium. The papillary carpeted anterior endometrial  surface was also resected superficially using the resectoscope. Subsequently a gentle sharp curettage was performed. Both specimens were sent separately to pathology.  Repeat hysteroscopy showed an empty cavity with good distention and no evidence of perforation. The instruments were removed and adequate hemostasis was visualized at the tenaculum site and external cervical os. The patient was given intraoperative Toradol, was awakened without difficulty and was taken to the recovery room in good condition having tolerated the procedure well.     [name],Jeromy Borcherding P MD, 2:06 PM 09/11/2016

## 2016-09-11 NOTE — Discharge Instructions (Signed)
° °  Postoperative Instructions Hysteroscopy D & C ° °Dr. Fontaine and the nursing staff have discussed postoperative instructions with you.  If you have any questions please ask them before you leave the hospital, or call Dr Fontaine’s office at [phone].   ° °We would like to emphasize the following instructions: ° ° °? Call the office to make your follow-up appointment as recommended by Dr Fontaine (usually 1-2 weeks). ° °? You were given a prescription, or one was ordered for you at the pharmacy you designated.  Get that prescription filled and take the medication according to instructions. ° °? You may eat a regular diet, but slowly until you start having bowel movements. ° °? Drink plenty of water daily. ° °? Nothing in the vagina (intercourse, douching, objects of any kind) for two weeks.  When reinitiating intercourse, if it is uncomfortable, stop and make an appointment with Dr Fontaine to be evaluated. ° °? No driving for one to two days until the effects of anesthesia has worn off.  No traveling out of town for several days. ° °? You may shower, but no baths for one week.  Walking up and down stairs is ok.  No heavy lifting, prolonged standing, repeated bending or any “working out” until your post op check. ° °? Rest frequently, listen to your body and do not push yourself and overdo it. ° °? Call if: ° °o Your pain medication does not seem strong enough. °o Worsening pain or abdominal bloating °o Persistent nausea or vomiting °o Difficulty with urination or bowel movements. °o Temperature of 101 degrees or higher. °o Heavy vaginal bleeding.  If your period is due, you may use tampons. °o You have any questions or concerns ° ° ° °Post Anesthesia Home Care Instructions ° °Activity: °Get plenty of rest for the remainder of the day. A responsible adult should stay with you for 24 hours following the procedure.  °For the next 24 hours, DO NOT: °-Drive a car °-Operate machinery °-Drink alcoholic  beverages °-Take any medication unless instructed by your physician °-Make any legal decisions or sign important papers. ° °Meals: °Start with liquid foods such as gelatin or soup. Progress to regular foods as tolerated. Avoid greasy, spicy, heavy foods. If nausea and/or vomiting occur, drink only clear liquids until the nausea and/or vomiting subsides. Call your physician if vomiting continues. ° °Special Instructions/Symptoms: °Your throat may feel dry or sore from the anesthesia or the breathing tube placed in your throat during surgery. If this causes discomfort, gargle with warm salt water. The discomfort should disappear within 24 hours. ° °If you had a scopolamine patch placed behind your ear for the management of post- operative nausea and/or vomiting: ° °1. The medication in the patch is effective for 72 hours, after which it should be removed.  Wrap patch in a tissue and discard in the trash. Wash hands thoroughly with soap and water. °2. You may remove the patch earlier than 72 hours if you experience unpleasant side effects which may include dry mouth, dizziness or visual disturbances. °3. Avoid touching the patch. Wash your hands with soap and water after contact with the patch. °  ° °

## 2016-09-11 NOTE — Anesthesia Preprocedure Evaluation (Signed)
Anesthesia Evaluation  Patient identified by MRN, date of birth, ID band Patient awake    Reviewed: Allergy & Precautions, NPO status , Patient's Chart, lab work & pertinent test results  History of Anesthesia Complications Negative for: history of anesthetic complications  Airway Mallampati: II  TM Distance: >3 FB Neck ROM: Full    Dental  (+) Teeth Intact   Pulmonary neg pulmonary ROS,    breath sounds clear to auscultation       Cardiovascular negative cardio ROS   Rhythm:Regular     Neuro/Psych negative neurological ROS  negative psych ROS   GI/Hepatic negative GI ROS, Neg liver ROS,   Endo/Other  negative endocrine ROS  Renal/GU negative Renal ROS     Musculoskeletal  (+) Arthritis ,   Abdominal   Peds  Hematology negative hematology ROS (+)   Anesthesia Other Findings   Reproductive/Obstetrics                             Anesthesia Physical Anesthesia Plan  ASA: I  Anesthesia Plan: General   Post-op Pain Management:    Induction: Intravenous  Airway Management Planned: LMA  Additional Equipment: None  Intra-op Plan:   Post-operative Plan: Extubation in OR  Informed Consent: I have reviewed the patients History and Physical, chart, labs and discussed the procedure including the risks, benefits and alternatives for the proposed anesthesia with the patient or authorized representative who has indicated his/her understanding and acceptance.   Dental advisory given  Plan Discussed with: CRNA and Surgeon  Anesthesia Plan Comments:         Anesthesia Quick Evaluation

## 2016-09-11 NOTE — Transfer of Care (Signed)
Immediate Anesthesia Transfer of Care Note  Patient: Cynthia Trevino  Procedure(s) Performed: Procedure(s) with comments: DILATATION & CURETTAGE/HYSTEROSCOPY WITH MYOSURE (N/A) - request 7:30am time  requests one hour OR time  Patient Location: PACU  Anesthesia Type:General  Level of Consciousness: awake, alert  and oriented  Airway & Oxygen Therapy: Patient Spontanous Breathing and Patient connected to nasal cannula oxygen  Post-op Assessment: Report given to RN and Post -op Vital signs reviewed and stable  Post vital signs: Reviewed and stable  Last Vitals:116/75, 84, 17, 100%,  97.4 Vitals:   09/11/16 1138  BP: 132/74  Pulse: 94  Resp: 16  Temp: 36.9 C    Last Pain:  Vitals:   09/11/16 1209  TempSrc:   PainSc: 6       Patients Stated Pain Goal: 7 (09/11/16 1209)  Complications: No apparent anesthesia complications

## 2016-09-11 NOTE — Anesthesia Procedure Notes (Signed)
Procedure Name: LMA Insertion Date/Time: 09/11/2016 12:50 PM Performed by: Maris BergerENENNY, Cynthia Trevino Pre-anesthesia Checklist: Patient identified, Emergency Drugs available, Suction available and Patient being monitored Patient Re-evaluated:Patient Re-evaluated prior to inductionOxygen Delivery Method: Circle system utilized Preoxygenation: Pre-oxygenation with 100% oxygen Intubation Type: IV induction Ventilation: Mask ventilation without difficulty LMA: LMA inserted LMA Size: 4.0 Number of attempts: 1 Airway Equipment and Method: Bite block Placement Confirmation: positive ETCO2 Dental Injury: Teeth and Oropharynx as per pre-operative assessment

## 2016-09-12 ENCOUNTER — Encounter (HOSPITAL_BASED_OUTPATIENT_CLINIC_OR_DEPARTMENT_OTHER): Payer: Self-pay | Admitting: Gynecology

## 2016-09-13 ENCOUNTER — Ambulatory Visit: Payer: 59 | Admitting: Gynecology

## 2016-09-14 ENCOUNTER — Encounter: Payer: Self-pay | Admitting: Gynecology

## 2016-09-14 ENCOUNTER — Ambulatory Visit (INDEPENDENT_AMBULATORY_CARE_PROVIDER_SITE_OTHER): Payer: 59 | Admitting: Gynecology

## 2016-09-14 ENCOUNTER — Telehealth: Payer: Self-pay | Admitting: *Deleted

## 2016-09-14 VITALS — BP 118/76 | Temp 97.3°F

## 2016-09-14 DIAGNOSIS — N939 Abnormal uterine and vaginal bleeding, unspecified: Secondary | ICD-10-CM

## 2016-09-14 LAB — CBC WITH DIFFERENTIAL/PLATELET
Basophils Absolute: 0 cells/uL (ref 0–200)
Basophils Relative: 0 %
EOS ABS: 186 {cells}/uL (ref 15–500)
Eosinophils Relative: 2 %
HEMATOCRIT: 38.8 % (ref 35.0–45.0)
HEMOGLOBIN: 13 g/dL (ref 11.7–15.5)
LYMPHS ABS: 3162 {cells}/uL (ref 850–3900)
LYMPHS PCT: 34 %
MCH: 31 pg (ref 27.0–33.0)
MCHC: 33.5 g/dL (ref 32.0–36.0)
MCV: 92.6 fL (ref 80.0–100.0)
MONO ABS: 651 {cells}/uL (ref 200–950)
MPV: 8.9 fL (ref 7.5–12.5)
Monocytes Relative: 7 %
NEUTROS PCT: 57 %
Neutro Abs: 5301 cells/uL (ref 1500–7800)
Platelets: 359 10*3/uL (ref 140–400)
RBC: 4.19 MIL/uL (ref 3.80–5.10)
RDW: 13.1 % (ref 11.0–15.0)
WBC: 9.3 10*3/uL (ref 3.8–10.8)

## 2016-09-14 MED ORDER — TRANEXAMIC ACID 650 MG PO TABS: 1300.0000 mg | ORAL_TABLET | Freq: Three times a day (TID) | ORAL | 0 refills | Status: DC

## 2016-09-14 NOTE — Progress Notes (Signed)
    Cynthia Trevino 12/28/1970 [id number]        46 y.o.  G2P2 presents 3 days postop complaining of heavy bleeding. Had done well until today eating drinking avoiding having bowel movements without difficulty when she started to pick up bleeding and started passing several clots. Also feels bloated with chills. Status post uncomplicated hysteroscopic resection of endometrial polyps 3 days ago and she received antibiotic prophylaxis with this.  Past medical history,surgical history, problem list, medications, allergies, family history and social history were all reviewed and documented in the EPIC chart.  Directed ROS with pertinent positives and negatives documented in the history of present illness/assessment and plan.  Exam: [name] assistant Vitals:   09/14/16 1438  BP: 118/76  Temp: 97.3 F (36.3 C)  TempSrc: Oral   General appearance:  Normal I no acute distress Abdomen soft nontender without masses guarding rebound Pelvic external BUS vagina with several small clots in the vault no significant active bleeding. Uterus grossly normal size midline mobile nontender. Adnexa without masses or tenderness  Assessment/Plan:  46 y.o. G2P2 heavy bleeding 3 days postop now appears to be decreasing. Is feeling bloated with chills. Temperature is normal. Exam is benign. Recommend Lysteda 1300 mg 3 times a day times several days to abate the bleeding. Risks include thrombosis such as stroke heart attack DVT reviewed.  ASAP call precautions discussed to include fevers, feeling worse and started feeling better, increasing pain, persistent heavy bleeding. Will check baseline CBC today.    [name],Laila Myhre P MD, 2:51 PM 09/14/2016

## 2016-09-14 NOTE — Patient Instructions (Signed)
start the medicine prescribed taking 2 pills 3 times daily to stop the bleeding. Call over the weekend if increasing pain, fevers, heavy bleeding continues.

## 2016-09-14 NOTE — Telephone Encounter (Signed)
Pt had D&C surgery on 09/11/16, doing well, asked if normal to feel bloated? States she has sharp pain that come and go  in her stomach, she took antacid which helped. Just feels bloated,, also she has been spotting since Wednesday , not heavy. She asked how long should she expect the spotting to last? No fever, vomiting, chills, temp, or pain, able to urinate and having bowel movements. She just wanted to confirm all the above is normal process? Please advise

## 2016-09-14 NOTE — Telephone Encounter (Signed)
Dr.Fontaine pt called back c/o feeling worse, bleeding has increased to heavy, has chills and doesn't feel well. Pt will be here at the office in 20 minutes as a work in.

## 2016-09-24 ENCOUNTER — Telehealth: Payer: Self-pay | Admitting: *Deleted

## 2016-09-24 MED ORDER — MEGESTROL ACETATE 20 MG PO TABS: 20.0000 mg | ORAL_TABLET | Freq: Every day | ORAL | 0 refills | Status: DC

## 2016-09-24 NOTE — Telephone Encounter (Signed)
Pt called to follow up from OV 09/14/16 was prescribed "Lysteda 1,300 mg 3 times a day times several days to abate the bleeding" pt never started Rx states it cost over $100 and she cant afford medication. Pt said bleeding never stopped, today she bled onto her pants and passed a big clot. She asked if another medication can be prescribed or a short dose of lysteda? Pt has her post-op appointment tomorrow. Please advise

## 2016-09-24 NOTE — Telephone Encounter (Signed)
Megace 20 mg 3 times daily until bleeding slows then twice daily for several days then once daily for 1 week #30

## 2016-09-24 NOTE — Telephone Encounter (Signed)
Pt informed with the below note, Rx sent. 

## 2016-09-25 ENCOUNTER — Ambulatory Visit (INDEPENDENT_AMBULATORY_CARE_PROVIDER_SITE_OTHER): Payer: 59 | Admitting: Gynecology

## 2016-09-25 ENCOUNTER — Encounter: Payer: Self-pay | Admitting: Gynecology

## 2016-09-25 VITALS — BP 130/80

## 2016-09-25 DIAGNOSIS — Z9889 Other specified postprocedural states: Secondary | ICD-10-CM

## 2016-09-25 NOTE — Progress Notes (Signed)
    Serita Gritracey A Minder 05/11/1971 [id number]        46 y.o.  G2P2 presents for her postoperative visit status post hysteroscopic resection of endometrial polyps. She is continuing to bleed on and off since surgery sometimes passing large clots. Was prescribed Lysteda but it was too expensive and she is on Megace now 20 mg 3 times a day which has slowed her bleeding. She's going to taper off of this.  Past medical history,surgical history, problem list, medications, allergies, family history and social history were all reviewed and documented in the EPIC chart.  Directed ROS with pertinent positives and negatives documented in the history of present illness/assessment and plan.  Exam: [name] assistant Vitals:   09/25/16 1216  BP: 130/80   General appearance:  Normal Abdomen soft nontender without masses guarding rebound Pelvic external BUS vagina with light menses flow. Cervix normal. Uterus normal size midline mobile nontender. Adnexa without masses or tenderness.  Assessment/Plan:  46 y.o. G2P2 with continued bleeding after hysteroscopy D&C. We'll continue on Megace for now. Long-term options reviewed to include continued hormonal manipulation, Mirena IUD, endometrial ablation, hysterectomy. She did have a Mirena IUD previously but it dislodged and had to be removed. She currently is not sexually active but would need this for birth control when ever she does become sexually active. My recommendation would be to stay on Megace now and place a Mirena IUD to see if we can control her bleeding. Patient agrees with this and will schedule the appointment.    [name],Carolynn Tuley P MD, 12:29 PM 09/25/2016

## 2016-09-25 NOTE — Patient Instructions (Addendum)
Follow-up for Mirena IUD placement as arranged 

## 2016-10-02 ENCOUNTER — Encounter: Payer: Self-pay | Admitting: Gynecology

## 2016-10-02 ENCOUNTER — Ambulatory Visit (INDEPENDENT_AMBULATORY_CARE_PROVIDER_SITE_OTHER): Payer: 59 | Admitting: Gynecology

## 2016-10-02 VITALS — BP 118/74

## 2016-10-02 DIAGNOSIS — Z30430 Encounter for insertion of intrauterine contraceptive device: Secondary | ICD-10-CM

## 2016-10-02 HISTORY — PX: INTRAUTERINE DEVICE INSERTION: SHX323

## 2016-10-02 NOTE — Progress Notes (Signed)
    Serita Gritracey A Nader 06/22/1971 [id number]        46 y.o.  G2P2  presents for Mirena IUD placement. She has read through the booklet, has no contraindications and signed the consent form. She currently is on her Megace. Is not sexually active.  I reviewed the insertional process with her as well as the risks to include infection, either immediate or long-term, uterine perforation or migration requiring surgery to remove, other complications such as pain, hormonal side effects and possibility of failure with subsequent pregnancy.   Exam with [name] assistant Vitals:   10/02/16 1400  BP: 118/74    Pelvic: External BUS vagina normal. Cervix normal. Uterus anteverted normal size shape contour midline mobile nontender. Adnexa without masses or tenderness.  Procedure: The cervix was cleansed with Betadine, anterior lip grasped with a single-tooth tenaculum, the uterus was sounded and a Mirena IUD was placed according to manufacturer's recommendations without difficulty. The strings were trimmed. The patient tolerated well and will follow up in one month for a postinsertional check. I asked her to stay on the Megace for another several days and then to go ahead and discontinue it and we'll see her bleeding does.  Lot number:  [id number]    [name],Meggin Ola P MD, 2:15 PM 10/02/2016

## 2016-10-02 NOTE — Patient Instructions (Signed)
Intrauterine Device Insertion Most often, an intrauterine device (IUD) is inserted into the uterus to prevent pregnancy. There are 2 types of IUDs available:  Copper IUD-This type of IUD creates an environment that is not favorable to sperm survival. The mechanism of action of the copper IUD is not known for certain. It can stay in place for 10 years.  Hormone IUD-This type of IUD contains the hormone progestin (synthetic progesterone). The progestin thickens the cervical mucus and prevents sperm from entering the uterus, and it also thins the uterine lining. There is no evidence that the hormone IUD prevents implantation. One hormone IUD can stay in place for up to 5 years, and a different hormone IUD can stay in place for up to 3 years. An IUD is the most cost-effective birth control if left in place for the full duration. It may be removed at any time. LET YOUR HEALTH CARE PROVIDER KNOW ABOUT:  Any allergies you have.  All medicines you are taking, including vitamins, herbs, eye drops, creams, and over-the-counter medicines.  Previous problems you or members of your family have had with the use of anesthetics.  Any blood disorders you have.  Previous surgeries you have had.  Possibility of pregnancy.  Medical conditions you have. RISKS AND COMPLICATIONS  Generally, intrauterine device insertion is a safe procedure. However, as with any procedure, complications can occur. Possible complications include:  Accidental puncture (perforation) of the uterus.  Accidental placement of the IUD either in the muscle layer of the uterus (myometrium) or outside the uterus. If this happens, the IUD can be found essentially floating around the bowels and must be taken out surgically.  The IUD may fall out of the uterus (expulsion). This is more common in women who have recently had a child.   Pregnancy in the fallopian tube (ectopic).  Pelvic inflammatory disease (PID), which is infection of  the uterus and fallopian tubes. The risk of PID is slightly increased in the first 20 days after the IUD is placed, but the overall risk is still very low. BEFORE THE PROCEDURE  Schedule the IUD insertion for when you will have your menstrual period or right after, to make sure you are not pregnant. Placement of the IUD is better tolerated shortly after a menstrual cycle.  You may need to take tests or be examined to make sure you are not pregnant.  You may be required to take a pregnancy test.  You may be required to get checked for sexually transmitted infections (STIs) prior to placement. Placing an IUD in someone who has an infection can make the infection worse.  You may be given a pain reliever to take 1 or 2 hours before the procedure.  An exam will be performed to determine the size and position of your uterus.  Ask your health care provider about changing or stopping your regular medicines. PROCEDURE   A tool (speculum) is placed in the vagina. This allows your health care provider to see the lower part of the uterus (cervix).  The cervix is prepped with a medicine that lowers the risk of infection.  You may be given a medicine to numb each side of the cervix (intracervical or paracervical block). This is used to block and control any discomfort with insertion.  A tool (uterine sound) is inserted into the uterus to determine the length of the uterine cavity and the direction the uterus may be tilted.  A slim instrument (IUD inserter) is inserted through the cervical   canal and into your uterus.  The IUD is placed in the uterine cavity and the insertion device is removed.  The nylon string that is attached to the IUD and used for eventual IUD removal is trimmed. It is trimmed so that it lays high in the vagina, just outside the cervix. AFTER THE PROCEDURE  You may have bleeding after the procedure. This is normal. It varies from light spotting for a few days to menstrual-like  bleeding.  You may have mild cramping. This information is not intended to replace advice given to you by your health care provider. Make sure you discuss any questions you have with your health care provider. Document Released: 04/04/2011 Document Revised: 05/27/2013 Document Reviewed: 01/25/2013 Elsevier Interactive Patient Education  2017 Elsevier Inc.  

## 2016-10-30 ENCOUNTER — Encounter: Payer: Self-pay | Admitting: Gynecology

## 2016-10-30 ENCOUNTER — Ambulatory Visit (INDEPENDENT_AMBULATORY_CARE_PROVIDER_SITE_OTHER): Payer: 59 | Admitting: Gynecology

## 2016-10-30 VITALS — BP 126/80

## 2016-10-30 DIAGNOSIS — Z30431 Encounter for routine checking of intrauterine contraceptive device: Secondary | ICD-10-CM | POA: Diagnosis not present

## 2016-10-30 NOTE — Progress Notes (Signed)
    Serita Gritracey A Shrake 01/16/1971 [id number]        46 y.o.  G2P2 presents for follow up IUD check. Has continued bleed on and off since IUD placement. Started back on Megace last week due to heavier bleeding and now down to spotting.  Past medical history,surgical history, problem list, medications, allergies, family history and social history were all reviewed and documented in the EPIC chart.  Directed ROS with pertinent positives and negatives documented in the history of present illness/assessment and plan.  Exam: Biomedical scientistBlanca assistant Vitals:   10/30/16 1118  BP: 126/80   General appearance:  Normal External BUS vagina with scant bleeding. Cervix normal. IUD string visualized and trimmed. Uterus normal size midline mobile nontender. Adnexa without masses or tenderness.  Assessment/Plan:  46 y.o. G2P2 with history of heavy irregular bleeding since this past fall. Has been on Megace to control the bleeding. Short trial Delanna NoticeLysteda was too expensive. Now with Mirena IUD. We'll monitor for now. If continues to bleed heavy or irregular rediscussed options to include continued hormonal manipulation, endometrial ablation, hysterectomy. The pros and cons of each approach reviewed. Will rediscuss if her bleeding continues.    [name],Geraldyne Barraclough P MD, 11:45 AM 10/30/2016

## 2016-10-30 NOTE — Patient Instructions (Signed)
Call if heavy irregular bleeding continues.

## 2016-12-04 ENCOUNTER — Telehealth: Payer: Self-pay | Admitting: *Deleted

## 2016-12-04 MED ORDER — MEGESTROL ACETATE 20 MG PO TABS: 20.0000 mg | ORAL_TABLET | Freq: Every day | ORAL | 0 refills | Status: DC

## 2016-12-04 NOTE — Telephone Encounter (Signed)
Okay for Megace 20 mg #30 one by mouth twice a day for heavy bleeding to taper to one by mouth daily until bleeding gone

## 2016-12-04 NOTE — Telephone Encounter (Signed)
Pt has Mirena IUD , has been having spotting off and on, had bleeding that started yesterday heavy flow, changing pads every 2 hours. Pt said she will schedule OV in May to re-discuss since bleeding has continued. Pt doesn't have good insurance coverage now, but will in May. Pt asked if she could have refill on Megace. Please advise

## 2016-12-04 NOTE — Telephone Encounter (Signed)
Pt aware, Rx sent. 

## 2017-01-21 NOTE — Addendum Note (Signed)
Addendum  created 01/21/17 1024 by Kru Allman, MD   Sign clinical note    

## 2017-04-23 ENCOUNTER — Other Ambulatory Visit: Payer: Self-pay | Admitting: Gynecology

## 2017-04-23 ENCOUNTER — Telehealth: Payer: Self-pay

## 2017-04-23 DIAGNOSIS — Z30431 Encounter for routine checking of intrauterine contraceptive device: Secondary | ICD-10-CM

## 2017-04-23 DIAGNOSIS — N939 Abnormal uterine and vaginal bleeding, unspecified: Secondary | ICD-10-CM

## 2017-04-23 NOTE — Telephone Encounter (Signed)
Sent note to Cynthia Trevino informing her so she can schedule. Order placed.

## 2017-04-23 NOTE — Telephone Encounter (Signed)
Dr TF-[facility] make sure [facility] is in placed. Ok to order?

## 2017-04-23 NOTE — Telephone Encounter (Signed)
Patient called complaining of irregular bleeding with IUD. Has had a period every other week x 3.  Recommended OV and CLaudia will call her back to schedule.

## 2017-04-23 NOTE — Telephone Encounter (Signed)
Okay to schedule ultrasound at the time of the office visit

## 2017-04-25 ENCOUNTER — Ambulatory Visit (INDEPENDENT_AMBULATORY_CARE_PROVIDER_SITE_OTHER): Payer: Managed Care, Other (non HMO)

## 2017-04-25 ENCOUNTER — Encounter: Payer: Self-pay | Admitting: Gynecology

## 2017-04-25 ENCOUNTER — Other Ambulatory Visit: Payer: Self-pay | Admitting: Gynecology

## 2017-04-25 ENCOUNTER — Ambulatory Visit (INDEPENDENT_AMBULATORY_CARE_PROVIDER_SITE_OTHER): Payer: Managed Care, Other (non HMO) | Admitting: Gynecology

## 2017-04-25 VITALS — BP 118/76

## 2017-04-25 DIAGNOSIS — T839XXA Unspecified complication of genitourinary prosthetic device, implant and graft, initial encounter: Secondary | ICD-10-CM | POA: Diagnosis not present

## 2017-04-25 DIAGNOSIS — Z30431 Encounter for routine checking of intrauterine contraceptive device: Secondary | ICD-10-CM

## 2017-04-25 DIAGNOSIS — N926 Irregular menstruation, unspecified: Secondary | ICD-10-CM | POA: Diagnosis not present

## 2017-04-25 DIAGNOSIS — N939 Abnormal uterine and vaginal bleeding, unspecified: Secondary | ICD-10-CM

## 2017-04-25 DIAGNOSIS — T8332XA Displacement of intrauterine contraceptive device, initial encounter: Secondary | ICD-10-CM | POA: Diagnosis not present

## 2017-04-25 DIAGNOSIS — Z30432 Encounter for removal of intrauterine contraceptive device: Secondary | ICD-10-CM

## 2017-04-25 MED ORDER — NORETHINDRONE ACET-ETHINYL EST 1-20 MG-MCG PO TABS: 1.0000 | ORAL_TABLET | Freq: Every day | ORAL | 11 refills | Status: DC

## 2017-04-25 NOTE — Progress Notes (Signed)
    Cynthia Trevino A Cudney 08/07/1971 [id number]        46 y.o.  G2P2 presents planning of continued irregular bleeding. Had hysteroscopic resection of endometrial polyps. Had ongoing irregular bleeding and ultimately had a Mirena IUD placed 09/2016. Has issues with bleeding on and off since then and was recommended for office visit with ultrasound.  Past medical history,surgical history, problem list, medications, allergies, family history and social history were all reviewed and documented in the EPIC chart.  Directed ROS with pertinent positives and negatives documented in the history of present illness/assessment and plan.  Exam: [name] assistant Vitals:   04/25/17 1426  BP: 118/76   General appearance:  Normal Abdomen soft nontender without masses guarding rebound Pelvic external BUS vagina with light staining. Cervix with IUD extruding from external os. Uterus normal size midline mobile nontender. Adnexa without masses or tenderness.  [facility], shown to the patient and discarded.  Ultrasound: Transvaginal shows uterus normal size and echotexture. Endometrial echo 2.0 mm. IUD identified within the cervix. Right and left ovaries grossly normal. Cul-de-sac negative.  Assessment/Plan:  46 y.o. G2P2 with IUD extruding from the cervix. She previously had malposition of her IUD 2016. Continues of bleeding on and off. Had hysteroscopic resection of endometrial polyps earlier this year but has continued to bleed on and off since then. Options for management were reviewed to include attempting IUD again, low-dose oral contraceptives, progesterone only, endometrial ablation, hysterectomy. At this point the patient wants to try low-dose oral contraceptives. Risks to include thrombosis such as stroke heart attack DVT discussed. Loestrin 1/20 equivalent prescribed will go ahead and start these now in follow up if she has any issues after  initiation.    [name],TIMOTHY P MD, 3:05 PM 04/25/2017

## 2017-04-25 NOTE — Patient Instructions (Signed)
Start on the birth control pills as we discussed. Call me if you have any issues with this.

## 2017-06-11 ENCOUNTER — Ambulatory Visit (INDEPENDENT_AMBULATORY_CARE_PROVIDER_SITE_OTHER): Payer: Managed Care, Other (non HMO) | Admitting: Women's Health

## 2017-06-11 ENCOUNTER — Encounter: Payer: Self-pay | Admitting: Women's Health

## 2017-06-11 VITALS — BP 130/82

## 2017-06-11 DIAGNOSIS — Z113 Encounter for screening for infections with a predominantly sexual mode of transmission: Secondary | ICD-10-CM | POA: Diagnosis not present

## 2017-06-11 NOTE — Patient Instructions (Signed)
Endometrial Ablation Endometrial ablation is a procedure that destroys the thin inner layer of the lining of the uterus (endometrium). This procedure may be done:  To stop heavy periods.  To stop bleeding that is causing anemia.  To control irregular bleeding.  To treat bleeding caused by small tumors (fibroids) in the endometrium.  This procedure is often an alternative to major surgery, such as removal of the uterus and cervix (hysterectomy). As a result of this procedure:  You may not be able to have children. However, if you are premenopausal (you have not gone through menopause): ? You may still have a small chance of getting pregnant. ? You will need to use a reliable method of birth control after the procedure to prevent pregnancy.  You may stop having a menstrual period, or you may have only a small amount of bleeding during your period. Menstruation may return several years after the procedure.  Tell a health care provider about:  Any allergies you have.  All medicines you are taking, including vitamins, herbs, eye drops, creams, and over-the-counter medicines.  Any problems you or family members have had with the use of anesthetic medicines.  Any blood disorders you have.  Any surgeries you have had.  Any medical conditions you have. What are the risks? Generally, this is a safe procedure. However, problems may occur, including:  A hole (perforation) in the uterus or bowel.  Infection of the uterus, bladder, or vagina.  Bleeding.  Damage to other structures or organs.  An air bubble in the lung (air embolus).  Problems with pregnancy after the procedure.  Failure of the procedure.  Decreased ability to diagnose cancer in the endometrium.  What happens before the procedure?  You will have tests of your endometrium to make sure there are no pre-cancerous cells or cancer cells present.  You may have an ultrasound of the uterus.  You may be given  medicines to thin the endometrium.  Ask your health care provider about: ? Changing or stopping your regular medicines. This is especially important if you take diabetes medicines or blood thinners. ? Taking medicines such as aspirin and ibuprofen. These medicines can thin your blood. Do not take these medicines before your procedure if your doctor tells you not to.  Plan to have someone take you home from the hospital or clinic. What happens during the procedure?  You will lie on an exam table with your feet and legs supported as in a pelvic exam.  To lower your risk of infection: ? Your health care team will wash or sanitize their hands and put on germ-free (sterile) gloves. ? Your genital area will be washed with soap.  An IV tube will be inserted into one of your veins.  You will be given a medicine to help you relax (sedative).  A surgical instrument with a light and camera (resectoscope) will be inserted into your vagina and moved into your uterus. This allows your surgeon to see inside your uterus.  Endometrial tissue will be removed using one of the following methods: ? Radiofrequency. This method uses a radiofrequency-alternating electric current to remove the endometrium. ? Cryotherapy. This method uses extreme cold to freeze the endometrium. ? Heated-free liquid. This method uses a heated saltwater (saline) solution to remove the endometrium. ? Microwave. This method uses high-energy microwaves to heat up the endometrium and remove it. ? Thermal balloon. This method involves inserting a catheter with a balloon tip into the uterus. The balloon tip is   filled with heated fluid to remove the endometrium. The procedure may vary among health care providers and hospitals. What happens after the procedure?  Your blood pressure, heart rate, breathing rate, and blood oxygen level will be monitored until the medicines you were given have worn off.  As tissue healing occurs, you may  notice vaginal bleeding for 4-6 weeks after the procedure. You may also experience: ? Cramps. ? Thin, watery vaginal discharge that is light pink or brown in color. ? A need to urinate more frequently than usual. ? Nausea.  Do not drive for 24 hours if you were given a sedative.  Do not have sex or insert anything into your vagina until your health care provider approves. Summary  Endometrial ablation is done to treat the many causes of heavy menstrual bleeding.  The procedure may be done only after medications have been tried to control the bleeding.  Plan to have someone take you home from the hospital or clinic. This information is not intended to replace advice given to you by your health care provider. Make sure you discuss any questions you have with your health care provider. Document Released: 06/15/2004 Document Revised: 08/23/2016 Document Reviewed: 08/23/2016 Elsevier Interactive Patient Education  2017 Elsevier Inc.  

## 2017-06-11 NOTE — Progress Notes (Signed)
46 year old SWF G2P2 presents complaining of constant heavy vaginal bleeding x 2 weeks. Extensive history of this for the past year and managed by Dr. Audie BoxFontaine. Regular periods, heavy, at times for several weeks.  History of 2 IUDs that have had to be removed due to malposition,  Mirena on 04/25/17 removed. Started on Loestrin 2 months ago without improvement, has increased her emotional lability and breakthrough bleeding. Interested in other alternatives. Had prior discussion with Dr. Audie BoxFontaine about endometrial ablation, not interested in supracervical hysterectomy or another IUD. Sonohysterogram 08/01/16 with polyps, hysteroscopic resection of endometrial polyps 09/12/2016.  US 04/25/17, endometrium 2 mm. New partner , intercourse 1 with condom, first partner in 3 years. Denies vaginal discharge/itching/odor, abdominal pain, fever/chills, nausea/vomiting/diarrhea.   PE: Appears normal, no acute distress Pelvic exam: external genitalia within normal limits. Speculum exam: Scant menstrual blood visible. Cervix is pink without discharge from cervical os. Vaginal mucosa normal. GC/Chlamydia culture taken.   Menorrhagia - on Loestrin  Plan: Presented options and will schedule for endometrial ablation with Dr. Audie BoxFontaine. Counseled on benefits/risks and can usually expect lighter periods within a year of the procedure. Continue Loestrin until procedure date and use condoms. GC/Chlamydia cultures pending. Will discuss with Dr. Audie BoxFontaine need for repeat sonohysterogram prior to ablation.

## 2017-06-12 ENCOUNTER — Telehealth: Payer: Self-pay | Admitting: *Deleted

## 2017-06-12 LAB — C. TRACHOMATIS/N. GONORRHOEAE RNA
C. TRACHOMATIS RNA, TMA: NOT DETECTED
N. gonorrhoeae RNA, TMA: NOT DETECTED

## 2017-06-12 NOTE — Telephone Encounter (Signed)
Pt was seen yesterday states you mentioned to her a birth control pill that had "no hormones" was an options due to current pills side effect. Pt asked if she could have this Rx?

## 2017-06-12 NOTE — Telephone Encounter (Signed)
Telephone call, questions answered, will continue with the Loestrin, reviewed possibly after the ablation the Micronor which is a progestin only pill that she was thinking of maybe effective for both birth control as well as were controlled cycles. Will schedule ablation with Dr. Audie BoxFontaine.  Do you check benefits for ablations? menorrhagia, (OC and Mirena IUD failure)

## 2017-06-12 NOTE — Telephone Encounter (Signed)
Dr. Velvet BatheF- I am happy to check benefits for patient. Her last SHGM was 07/2016 although recent u/s.  Are you okay with me to proceed with scheduling if benefits are agreeable with patient?

## 2017-06-13 ENCOUNTER — Telehealth: Payer: Self-pay

## 2017-06-13 NOTE — Telephone Encounter (Signed)
okay to proceed with endometrial ablation if patient desires

## 2017-06-13 NOTE — Telephone Encounter (Signed)
I called patient and discussed ins benefits for her option ablation in office. She has $60 copyment and ins pays 100%.    Patient has been bleeding since 05/29/17. She said it got heavy yesterday and this morning she woke up and hardly made it to restroom.  Cramping badly too.  I told her I would need to check with you to see how I go about picking when to schedule this since cycle not regular.

## 2017-06-13 NOTE — Telephone Encounter (Signed)
First question is she sexually active and if so what is she doing for contraception.  Assuming pregnancy not possible then Megace 40 mg daily and then schedule the her option after being on this for several weeks.

## 2017-06-14 ENCOUNTER — Other Ambulatory Visit: Payer: Self-pay | Admitting: Gynecology

## 2017-06-14 DIAGNOSIS — N921 Excessive and frequent menstruation with irregular cycle: Secondary | ICD-10-CM

## 2017-06-14 MED ORDER — MISOPROSTOL 200 MCG PO TABS: ORAL_TABLET | ORAL | 0 refills | Status: DC

## 2017-06-14 MED ORDER — MEGESTROL ACETATE 40 MG PO TABS: 40.0000 mg | ORAL_TABLET | Freq: Every day | ORAL | 0 refills | Status: DC

## 2017-06-14 NOTE — Telephone Encounter (Signed)
Dr. Tilda BurrowF-I asked patient if sexually active and what she is doing for contraception. She said she is not sexually active at this time and pregnancy is not a concern.  We discussed that you recommended Megace 40 mg daily if no chance of pregnancy and she would like to start that and schedule in several weeks as you advised. Rx was sent.  Scheduled her for 07/01/17 at 8:30am.  Reviewed need for full bladder and full bladder instructions with her and will mail her instruction sheet as well.  Reviewed need for Cytotec tab vaginally hs before procedure and Rx was sent.  Reviewed with her that she will be premedicated and will need someone to drive her to and from the procedure.  Reviewed with her that our appointment scheduler with be calling her to schedule a pre op appt with Dr. Velvet BatheF and a follow up post op appt for 2 weeks after surgery. Staff message sent to Cecil CrankerDonna M.

## 2017-06-18 ENCOUNTER — Encounter: Payer: Self-pay | Admitting: Gynecology

## 2017-06-20 ENCOUNTER — Ambulatory Visit (INDEPENDENT_AMBULATORY_CARE_PROVIDER_SITE_OTHER): Payer: Managed Care, Other (non HMO) | Admitting: Gynecology

## 2017-06-20 ENCOUNTER — Encounter: Payer: Self-pay | Admitting: Gynecology

## 2017-06-20 VITALS — BP 120/70

## 2017-06-20 DIAGNOSIS — N920 Excessive and frequent menstruation with regular cycle: Secondary | ICD-10-CM | POA: Diagnosis not present

## 2017-06-20 MED ORDER — DIAZEPAM 5 MG PO TABS: 5.0000 mg | ORAL_TABLET | Freq: Four times a day (QID) | ORAL | 0 refills | Status: DC | PRN

## 2017-06-20 MED ORDER — AZITHROMYCIN 500 MG PO TABS
500.0000 mg | ORAL_TABLET | Freq: Every day | ORAL | 0 refills | Status: DC
Start: 2017-06-20 — End: 2017-07-15

## 2017-06-20 MED ORDER — OXYCODONE-ACETAMINOPHEN 5-325 MG PO TABS: 1.0000 | ORAL_TABLET | ORAL | 0 refills | Status: DC | PRN

## 2017-06-20 NOTE — Progress Notes (Signed)
    Cynthia Trevino        46 y.o.  G2P2 who presents for her preoperative visit for upcoming Heroption endometrial ablation.  Is a history of heavy irregular bleeding.  Underwent hysteroscopic resection of benign endometrial polyps earlier this year without relief of her bleeding.  Has had 2 Mirena IUDs placed in the past which ultimately were removed/extruded not being tolerated.  Had tried oral contraceptives and did not tolerate them from an emotional standpoint.  Had also tried the NuvaRing before but was not happy with this.  Most recent ultrasound 04/2017 showed a normal-appearing uterus with endometrial echo at 2.0 mm.  Right and left ovaries were normal.  She currently is not sexually active nor plans to be at this time.  Past medical history,surgical history, problem list, medications, allergies, family history and social history were all reviewed and documented in the EPIC chart.  Directed ROS with pertinent positives and negatives documented in the history of present illness/assessment and plan.  Exam: Cynthia Trevino assistant Vitals:   06/20/17 1222  BP: 120/70   General appearance:  Normal Lungs clear Cardiac regular rate without rubs murmurs or gallops Abdomen soft nontender without masses guarding rebound Pelvic external BUS vagina normal.  Cervix normal.  Uterus normal size midline mobile nontender.  Adnexa without masses or tenderness.  Assessment/Plan:  46 y.o. G2P2 with heavy menses and irregular bleeding.  Trial of Mirena IUD x2, oral contraceptives, NuvaRing all unsuccessful.  Had hysteroscopy D&C with resection of endometrial polyps with continued heavy irregular bleeding.  Final pathology showed benign polyps no evidence of hyperplasia, atypia or malignancy.  Options for management reviewed to include continued trial of hormonal manipulation, endometrial ablation, hysterectomy.  Currently not sexually active nor plans to be.  I did review with emphasis  that she should never pursue pregnancy following the procedure and that assured birth control is needed to prevent pregnancy should she become sexually active following the procedure.  The pregnancy as well as she herself would be at risk.  Options to include tubal sterilization now at the same time of ablation was discussed and offered.  Alternatives would be to consider permanent sterilization if she becomes sexually active or other forms of contraception such as Nexplanon.  Patient strongly wants to proceed with ablation now and she understands that she should never become pregnant and use contraception if she does become sexually active.  Currently on Megace 40 mg and will continue on this through her procedure.  At the intraoperative and postoperative courses were reviewed with her and the risks to include infection, hemorrhage necessitating transfusion, uterine damage/perforation with internal organ damage such as bowel bladder ureters vessels and nerves requiring major reparative surgeries.  No guarantees as far as bleeding relief were made she and she understands that she will more than likely continue to have menses following the procedure but hopefully lighter.  She may have heavier menses, more irregular or painful menses.  Lastly I reviewed her medications and usage with her and provided her prescriptions for her Valium, oxycodone, azithromycin.  She has a prescription for the Cytotec that she will use the evening before.  Her questions were answered to her satisfaction and she will follow-up for the procedure.    Cynthia Trevino,Cynthia Trevino P MD, 12:48 PM 06/20/2017

## 2017-06-20 NOTE — Patient Instructions (Signed)
Follow up for endometrial ablation as scheduled 

## 2017-07-01 ENCOUNTER — Other Ambulatory Visit: Payer: Managed Care, Other (non HMO)

## 2017-07-01 ENCOUNTER — Encounter: Payer: Self-pay | Admitting: Gynecology

## 2017-07-01 ENCOUNTER — Ambulatory Visit (INDEPENDENT_AMBULATORY_CARE_PROVIDER_SITE_OTHER): Payer: Managed Care, Other (non HMO) | Admitting: Gynecology

## 2017-07-01 ENCOUNTER — Ambulatory Visit (INDEPENDENT_AMBULATORY_CARE_PROVIDER_SITE_OTHER): Payer: Managed Care, Other (non HMO)

## 2017-07-01 VITALS — BP 120/78

## 2017-07-01 DIAGNOSIS — R102 Pelvic and perineal pain: Secondary | ICD-10-CM

## 2017-07-01 DIAGNOSIS — N920 Excessive and frequent menstruation with regular cycle: Secondary | ICD-10-CM

## 2017-07-01 DIAGNOSIS — N921 Excessive and frequent menstruation with irregular cycle: Secondary | ICD-10-CM | POA: Diagnosis not present

## 2017-07-01 MED ORDER — KETOROLAC TROMETHAMINE 30 MG/ML IJ SOLN
60.0000 mg | Freq: Once | INTRAMUSCULAR | Status: AC
  Administered 2017-07-01: 60 mg via INTRAMUSCULAR

## 2017-07-01 MED ORDER — LIDOCAINE HCL 1 % IJ SOLN
10.0000 mL | Freq: Once | INTRAMUSCULAR | Status: AC
  Administered 2017-07-01: 10 mL

## 2017-07-01 NOTE — Patient Instructions (Signed)
Follow-up for your postoperative appointment as scheduled.  Call sooner if any issues or questions.

## 2017-07-01 NOTE — Progress Notes (Signed)
HER OPTION ENDOMETRIAL ABLATION PROCEDURAL NOTE    [name] 10/26/1970 [id number]   07/01/2017  Diagnosis:  Excessive uterine bleeding / Menorrhagia  Procedure:  Endometrial cryoablation with intraoperative ultrasonic guidance  Procedure:  The patient was brought to the treatment room having previously been counseled for the procedure and having signed the consent form that is scanned into Epic. Pre procedural medications received were Valium 5 mg before leaving home, oxycodone/acetaminophen 5/325 1 tablet upon arrival and Toradol 30 mg IM before procedure.  The patient was placed in the dorsal lithotomy position and a speculum was inserted. The cervix and upper vagina were cleansed with Betadine. A single-tooth tenaculum was placed on the anterior lip of the cervix. A  paracervical block was placed Yes.   using 10 cc's of 1% lidocaine.  The uterus was Yes.   sounded 8 centimeters. Cervical dilatation performed No. Under ultrasound guidance, the Her Option probe was introduced into the uterine cavity after the preprocedural sequence was performed. After assuring proper cornual placement, cryoablation was then performed under continuous ultrasound guidance monitoring the growth of the cryo-zone. Sequential cryoablation's were performed in the following order:   Location   Length of Time Myometrial Depth 1. Right cornua   6 minutes  15.9 mm AP, 13.3 mm transverse 2. Left cornua   6 minutes  9.6 mm AP, 9.9 mm transverse   Upon completion of the procedure the instruments were removed, hemostasis visualized the patient was assisted to the bathroom and then to another exam room where she was observed. The patient tolerated the procedure well and was released in stable condition with her driver along with a copy of the postprocedural instructions and precautions which were reviewed with her. She is to return to the office in 2 weeks for post procedural check and received an appointment date and  time.    [name],Cynthia Trevino, 10:01 AM 07/01/2017

## 2017-07-05 ENCOUNTER — Encounter: Payer: Self-pay | Admitting: Gynecology

## 2017-07-15 ENCOUNTER — Encounter: Payer: Self-pay | Admitting: Gynecology

## 2017-07-15 ENCOUNTER — Ambulatory Visit (INDEPENDENT_AMBULATORY_CARE_PROVIDER_SITE_OTHER): Payer: Managed Care, Other (non HMO) | Admitting: Gynecology

## 2017-07-15 VITALS — BP 118/78

## 2017-07-15 DIAGNOSIS — Z4889 Encounter for other specified surgical aftercare: Secondary | ICD-10-CM

## 2017-07-15 NOTE — Patient Instructions (Signed)
Follow up for your annual exam as scheduled. 

## 2017-07-15 NOTE — Progress Notes (Signed)
    Cynthia Trevino 11/05/1970 [id number]        46 y.o.  G2P2 presents for her postoperative visit status post HerOption endometrial ablation 2 weeks ago.  Has done well.  Having mucousy bloody discharge.  No significant pain.  Past medical history,surgical history, problem list, medications, allergies, family history and social history were all reviewed and documented in the EPIC chart.  Directed ROS with pertinent positives and negatives documented in the history of present illness/assessment and plan.  Exam: [name] assistant Vitals:   07/15/17 1019  BP: 118/78   General appearance:  Normal Abdomen soft nontender without masses guarding rebound Pelvic external BUS vagina with bloody mucousy discharge.  Cervix normal.  Uterus grossly normal midline mobile nontender.  Adnexa without masses or tenderness.  Assessment/Plan:  46 y.o. G2P2 with normal postoperative visit status post HerOption endometrial ablation.  Has annual GYN exam tomorrow with Harriett SineNancy.  Will keep menstrual calendar and follow-up if she has any issues with this.    Dara Lordsimothy P Matthe Sloane MD, 10:41 AM 07/15/2017

## 2017-07-16 ENCOUNTER — Ambulatory Visit (INDEPENDENT_AMBULATORY_CARE_PROVIDER_SITE_OTHER): Payer: Managed Care, Other (non HMO) | Admitting: Women's Health

## 2017-07-16 ENCOUNTER — Encounter: Payer: Self-pay | Admitting: Women's Health

## 2017-07-16 VITALS — BP 110/80 | Ht 66.0 in | Wt 156.0 lb

## 2017-07-16 DIAGNOSIS — Z13220 Encounter for screening for lipoid disorders: Secondary | ICD-10-CM

## 2017-07-16 DIAGNOSIS — Z30015 Encounter for initial prescription of vaginal ring hormonal contraceptive: Secondary | ICD-10-CM

## 2017-07-16 DIAGNOSIS — Z9889 Other specified postprocedural states: Secondary | ICD-10-CM

## 2017-07-16 DIAGNOSIS — N920 Excessive and frequent menstruation with regular cycle: Secondary | ICD-10-CM | POA: Diagnosis not present

## 2017-07-16 DIAGNOSIS — Z01419 Encounter for gynecological examination (general) (routine) without abnormal findings: Secondary | ICD-10-CM | POA: Diagnosis not present

## 2017-07-16 LAB — COMPREHENSIVE METABOLIC PANEL
AG Ratio: 1.8 (calc) (ref 1.0–2.5)
ALKALINE PHOSPHATASE (APISO): 45 U/L (ref 33–115)
ALT: 10 U/L (ref 6–29)
AST: 11 U/L (ref 10–35)
Albumin: 4.6 g/dL (ref 3.6–5.1)
BILIRUBIN TOTAL: 0.5 mg/dL (ref 0.2–1.2)
BUN: 13 mg/dL (ref 7–25)
CO2: 25 mmol/L (ref 20–32)
CREATININE: 0.81 mg/dL (ref 0.50–1.10)
Calcium: 9.4 mg/dL (ref 8.6–10.2)
Chloride: 102 mmol/L (ref 98–110)
Globulin: 2.5 g/dL (calc) (ref 1.9–3.7)
Glucose, Bld: 95 mg/dL (ref 65–99)
Potassium: 4.2 mmol/L (ref 3.5–5.3)
SODIUM: 136 mmol/L (ref 135–146)
Total Protein: 7.1 g/dL (ref 6.1–8.1)

## 2017-07-16 LAB — CBC WITH DIFFERENTIAL/PLATELET
BASOS ABS: 63 {cells}/uL (ref 0–200)
Basophils Relative: 0.9 %
Eosinophils Absolute: 203 cells/uL (ref 15–500)
Eosinophils Relative: 2.9 %
HEMATOCRIT: 38.4 % (ref 35.0–45.0)
Hemoglobin: 13.5 g/dL (ref 11.7–15.5)
LYMPHS ABS: 2506 {cells}/uL (ref 850–3900)
MCH: 32 pg (ref 27.0–33.0)
MCHC: 35.2 g/dL (ref 32.0–36.0)
MCV: 91 fL (ref 80.0–100.0)
MPV: 9.4 fL (ref 7.5–12.5)
Monocytes Relative: 5.7 %
NEUTROS PCT: 54.7 %
Neutro Abs: 3829 cells/uL (ref 1500–7800)
Platelets: 303 10*3/uL (ref 140–400)
RBC: 4.22 10*6/uL (ref 3.80–5.10)
RDW: 11.7 % (ref 11.0–15.0)
TOTAL LYMPHOCYTE: 35.8 %
WBC: 7 10*3/uL (ref 3.8–10.8)
WBCMIX: 399 {cells}/uL (ref 200–950)

## 2017-07-16 LAB — LIPID PANEL
CHOL/HDL RATIO: 3.8 (calc) (ref <5.0)
Cholesterol: 195 mg/dL (ref <200)
HDL: 52 mg/dL (ref >50)
LDL CHOLESTEROL (CALC): 123 mg/dL — AB
NON-HDL CHOLESTEROL (CALC): 143 mg/dL — AB (ref <130)
TRIGLYCERIDES: 95 mg/dL (ref <150)

## 2017-07-16 MED ORDER — ETONOGESTREL-ETHINYL ESTRADIOL 0.12-0.015 MG/24HR VA RING: VAGINAL_RING | VAGINAL | 4 refills | Status: DC

## 2017-07-16 NOTE — Progress Notes (Signed)
Cynthia Trevino A Cynthia Trevino 01/25/1971 [id number]    History:    Presents for annual exam.  Endometrial ablation 07/01/2017 for menorrhagia, one episode of bloody discharge noted yesterday when wiping, none since then.. Normal mammogram history. 1996 LEEP with normal Paps after. Father diagnosed with colon cancer at age 48, has  had second recurrence. Has not had a screening colonoscopy. Same partner, currently not sexually active due to his hip replacement. Has lost 20 pounds since 11/2016  Past medical history, past surgical history, family history and social history were all reviewed and documented in the EPIC chart. Son, 48 years old, working and lives at home. Daughter, 48 years old, junior in high school, planning to attend college for either photography or culinary. Father type 2 diabetes, mother healthy. Maternal aunt breast cancer.    ROS:  A ROS was performed and pertinent positives and negatives are included.  Exam:  Vitals:   07/16/17 1053  BP: 110/80  Weight: 156 lb (70.8 kg)  Height: 5\' 6"  (1.676 m)   Body mass index is 25.18 kg/m.   General appearance:  Normal Thyroid:  Symmetrical, normal in size, without palpable masses or nodularity. Respiratory  Auscultation:  Clear without wheezing or rhonchi Cardiovascular  Auscultation:  Regular rate, without rubs, murmurs or gallops  Edema/varicosities:  Not grossly evident Abdominal  Soft,nontender, without masses, guarding or rebound.  Liver/spleen:  No organomegaly noted  Hernia:  None appreciated  Skin  Inspection:  Grossly normal   Breasts: Examined lying and sitting.     Right: Without masses, retractions, discharge or axillary adenopathy.     Left: Without masses, retractions, discharge or axillary adenopathy. Gentitourinary   Inguinal/mons:  Normal without inguinal adenopathy  External genitalia:  Normal  BUS/Urethra/Skene's glands:  Normal  Vagina:  Normal  Cervix:  Normal  Uterus:  Normal in size, shape and contour.   Midline and mobile  Adnexa/parametria:     Rt: Without masses or tenderness.   Lt: Without masses or tenderness.  Anus and perineum: Normal  Digital rectal exam: Normal sphincter tone without palpated masses or tenderness  Assessment/Plan:  46 y.o.DWF G2P2 presents for annual exam, s/p endometrial ablation 2 weeks.     Endometrial ablation, 06/2017 1996 LEEP with normal Paps after Contraception management  Plan: Contraception options reviewed, has used NuvaRing in the past without problem. Nuvaring prescription sent to pharmacy, reviewed minimal risk for blood clots and stroke. Counseled about proper use, contraception effective after 1 month, use condoms in interim if sexually active. Advised that periods should decrease in severity and flow over time. SBE's, annual screening mammogram encouraged, overdue instructed to schedule.  Encouraged to get colonoscopy, [facility] information provided. Congratulated on weight loss. Continue with active lifestyle and exercise, healthy diet, calcium rich foods, and vitamin D 1000 daily. Fasting, CBC w/ differential, CMP, lipid panel. 2017 Pap with HR HPV typing, new screening guidelines reviewed.  Harrington Challengerancy J Safira Proffit [facility] CenterWHNP, 11:28 AM 07/16/2017

## 2017-07-16 NOTE — Patient Instructions (Addendum)
Dr Carlean Purl at [facility]  [phone]   Ethinyl Estradiol; Etonogestrel vaginal ring What is this medicine? ETHINYL ESTRADIOL; ETONOGESTREL (ETH in il es tra DYE ole; et oh noe JES trel) vaginal ring is a flexible, vaginal ring used as a contraceptive (birth control method). This medicine combines two types of female hormones, an estrogen and a progestin. This ring is used to prevent ovulation and pregnancy. Each ring is effective for one month. This medicine may be used for other purposes; ask your health care provider or pharmacist if you have questions. COMMON BRAND NAME(S): NuvaRing What should I tell my health care provider before I take this medicine? They need to know if you have or ever had any of these conditions: -abnormal vaginal bleeding -blood vessel disease or blood clots -breast, cervical, endometrial, ovarian, liver, or uterine cancer -diabetes -gallbladder disease -heart disease or recent heart attack -high blood pressure -high cholesterol -kidney disease -liver disease -migraine headaches -stroke -systemic lupus erythematosus (SLE) -tobacco smoker -an unusual or allergic reaction to estrogens, progestins, other medicines, foods, dyes, or preservatives -pregnant or trying to get pregnant -breast-feeding How should I use this medicine? Insert the ring into your vagina as directed. Follow the directions on the prescription label. The ring will remain place for 3 weeks and is then removed for a 1-week break. A new ring is inserted 1 week after the last ring was removed, on the same day of the week. Check often to make sure the ring is still in place, especially before and after sexual intercourse. If the ring was out of the vagina for an unknown amount of time, you may not be protected from pregnancy. Perform a pregnancy test and call your doctor. Do not use more often than directed. A patient package insert for the product will be given with each prescription and refill.  Read this sheet carefully each time. The sheet may change frequently. Contact your pediatrician regarding the use of this medicine in children. Special care may be needed. This medicine has been used in female children who have started having menstrual periods. Overdosage: If you think you have taken too much of this medicine contact a poison control center or emergency room at once. NOTE: This medicine is only for you. Do not share this medicine with others. What if I miss a dose? You will need to replace your vaginal ring once a month as directed. If the ring should slip out, or if you leave it in longer or shorter than you should, contact your health care professional for advice. What may interact with this medicine? Do not take this medicine with the following medication: -dasabuvir; ombitasvir; paritaprevir; ritonavir -ombitasvir; paritaprevir; ritonavir This medicine may also interact with the following medications: -acetaminophen -antibiotics or medicines for infections, especially rifampin, rifabutin, rifapentine, and griseofulvin, and possibly penicillins or tetracyclines -aprepitant -ascorbic acid (vitamin C) -atorvastatin -barbiturate medicines, such as phenobarbital -bosentan -carbamazepine -caffeine -clofibrate -cyclosporine -dantrolene -doxercalciferol -felbamate -grapefruit juice -hydrocortisone -medicines for anxiety or sleeping problems, such as diazepam or temazepam -medicines for diabetes, including pioglitazone -modafinil -mycophenolate -nefazodone -oxcarbazepine -phenytoin -prednisolone -ritonavir or other medicines for HIV infection or AIDS -rosuvastatin -selegiline -soy isoflavones supplements -St. John's wort -tamoxifen or raloxifene -theophylline -thyroid hormones -topiramate -warfarin This list may not describe all possible interactions. Give your health care provider a list of all the medicines, herbs, non-prescription drugs, or dietary  supplements you use. Also tell them if you smoke, drink alcohol, or use illegal drugs. Some items may interact with  your medicine. What should I watch for while using this medicine? Visit your doctor or health care professional for regular checks on your progress. You will need a regular breast and pelvic exam and Pap smear while on this medicine. Use an additional method of contraception during the first cycle that you use this ring. Do not use a diaphragm or female condom, as the ring can interfere with these birth control methods and their proper placement. If you have any reason to think you are pregnant, stop using this medicine right away and contact your doctor or health care professional. If you are using this medicine for hormone related problems, it may take several cycles of use to see improvement in your condition. Smoking increases the risk of getting a blood clot or having a stroke while you are using hormonal birth control, especially if you are more than 48 years old. You are strongly advised not to smoke. This medicine can make your body retain fluid, making your fingers, hands, or ankles swell. Your blood pressure can go up. Contact your doctor or health care professional if you feel you are retaining fluid. This medicine can make you more sensitive to the sun. Keep out of the sun. If you cannot avoid being in the sun, wear protective clothing and use sunscreen. Do not use sun lamps or tanning beds/booths. If you wear contact lenses and notice visual changes, or if the lenses begin to feel uncomfortable, consult your eye care specialist. In some women, tenderness, swelling, or minor bleeding of the gums may occur. Notify your dentist if this happens. Brushing and flossing your teeth regularly may help limit this. See your dentist regularly and inform your dentist of the medicines you are taking. If you are going to have elective surgery, you may need to stop using this medicine before  the surgery. Consult your health care professional for advice. This medicine does not protect you against HIV infection (AIDS) or any other sexually transmitted diseases. What side effects may I notice from receiving this medicine? Side effects that you should report to your doctor or health care professional as soon as possible: -breast tissue changes or discharge -changes in vaginal bleeding during your period or between your periods -chest pain -coughing up blood -dizziness or fainting spells -headaches or migraines -leg, arm or groin pain -severe or sudden headaches -stomach pain (severe) -sudden shortness of breath -sudden loss of coordination, especially on one side of the body -speech problems -symptoms of vaginal infection like itching, irritation or unusual discharge -tenderness in the upper abdomen -vomiting -weakness or numbness in the arms or legs, especially on one side of the body -yellowing of the eyes or skin Side effects that usually do not require medical attention (report to your doctor or health care professional if they continue or are bothersome): -breakthrough bleeding and spotting that continues beyond the 3 initial cycles of pills -breast tenderness -mood changes, anxiety, depression, frustration, anger, or emotional outbursts -increased sensitivity to sun or ultraviolet light -nausea -skin rash, acne, or brown spots on the skin -weight gain (slight) This list may not describe all possible side effects. Call your doctor for medical advice about side effects. You may report side effects to FDA at 1-800-FDA-1088. Where should I keep my medicine? Keep out of the reach of children. Store at room temperature between 15 and 30 degrees C (59 and 86 degrees F) for up to 4 months. The product will expire after 4 months. Protect from light. Throw away  any unused medicine after the expiration date. NOTE: This sheet is a summary. It may not cover all possible  information. If you have questions about this medicine, talk to your doctor, pharmacist, or health care provider.  2018 Elsevier/Gold Standard (2016-04-13 17:00:31) Health Maintenance, Female Adopting a healthy lifestyle and getting preventive care can go a long way to promote health and wellness. Talk with your health care provider about what schedule of regular examinations is right for you. This is a good chance for you to check in with your provider about disease prevention and staying healthy. In between checkups, there are plenty of things you can do on your own. Experts have done a lot of research about which lifestyle changes and preventive measures are most likely to keep you healthy. Ask your health care provider for more information. Weight and diet Eat a healthy diet  Be sure to include plenty of vegetables, fruits, low-fat dairy products, and lean protein.  Do not eat a lot of foods high in solid fats, added sugars, or salt.  Get regular exercise. This is one of the most important things you can do for your health. ? Most adults should exercise for at least 150 minutes each week. The exercise should increase your heart rate and make you sweat (moderate-intensity exercise). ? Most adults should also do strengthening exercises at least twice a week. This is in addition to the moderate-intensity exercise.  Maintain a healthy weight  Body mass index (BMI) is a measurement that can be used to identify possible weight problems. It estimates body fat based on height and weight. Your health care provider can help determine your BMI and help you achieve or maintain a healthy weight.  For females 29 years of age and older: ? A BMI below 18.5 is considered underweight. ? A BMI of 18.5 to 24.9 is normal. ? A BMI of 25 to 29.9 is considered overweight. ? A BMI of 30 and above is considered obese.  Watch levels of cholesterol and blood lipids  You should start having your blood tested for  lipids and cholesterol at 48 years of age, then have this test every 5 years.  You may need to have your cholesterol levels checked more often if: ? Your lipid or cholesterol levels are high. ? You are older than 48 years of age. ? You are at high risk for heart disease.  Cancer screening Lung Cancer  Lung cancer screening is recommended for adults 25-49 years old who are at high risk for lung cancer because of a history of smoking.  A yearly low-dose CT scan of the lungs is recommended for people who: ? Currently smoke. ? Have quit within the past 15 years. ? Have at least a 30-pack-year history of smoking. A pack year is smoking an average of one pack of cigarettes a day for 1 year.  Yearly screening should continue until it has been 15 years since you quit.  Yearly screening should stop if you develop a health problem that would prevent you from having lung cancer treatment.  Breast Cancer  Practice breast self-awareness. This means understanding how your breasts normally appear and feel.  It also means doing regular breast self-exams. Let your health care provider know about any changes, no matter how small.  If you are in your 20s or 30s, you should have a clinical breast exam (CBE) by a health care provider every 1-3 years as part of a regular health exam.  If you are  3 or older, have a CBE every year. Also consider having a breast X-ray (mammogram) every year.  If you have a family history of breast cancer, talk to your health care provider about genetic screening.  If you are at high risk for breast cancer, talk to your health care provider about having an MRI and a mammogram every year.  Breast cancer gene (BRCA) assessment is recommended for women who have family members with BRCA-related cancers. BRCA-related cancers include: ? Breast. ? Ovarian. ? Tubal. ? Peritoneal cancers.  Results of the assessment will determine the need for genetic counseling and BRCA1 and  BRCA2 testing.  Cervical Cancer Your health care provider may recommend that you be screened regularly for cancer of the pelvic organs (ovaries, uterus, and vagina). This screening involves a pelvic examination, including checking for microscopic changes to the surface of your cervix (Pap test). You may be encouraged to have this screening done every 3 years, beginning at age 60.  For women ages 19-65, health care providers may recommend pelvic exams and Pap testing every 3 years, or they may recommend the Pap and pelvic exam, combined with testing for human papilloma virus (HPV), every 5 years. Some types of HPV increase your risk of cervical cancer. Testing for HPV may also be done on women of any age with unclear Pap test results.  Other health care providers may not recommend any screening for nonpregnant women who are considered low risk for pelvic cancer and who do not have symptoms. Ask your health care provider if a screening pelvic exam is right for you.  If you have had past treatment for cervical cancer or a condition that could lead to cancer, you need Pap tests and screening for cancer for at least 20 years after your treatment. If Pap tests have been discontinued, your risk factors (such as having a new sexual partner) need to be reassessed to determine if screening should resume. Some women have medical problems that increase the chance of getting cervical cancer. In these cases, your health care provider may recommend more frequent screening and Pap tests.  Colorectal Cancer  This type of cancer can be detected and often prevented.  Routine colorectal cancer screening usually begins at 48 years of age and continues through 48 years of age.  Your health care provider may recommend screening at an earlier age if you have risk factors for colon cancer.  Your health care provider may also recommend using home test kits to check for hidden blood in the stool.  A small camera at the  end of a tube can be used to examine your colon directly (sigmoidoscopy or colonoscopy). This is done to check for the earliest forms of colorectal cancer.  Routine screening usually begins at age 64.  Direct examination of the colon should be repeated every 5-10 years through 48 years of age. However, you may need to be screened more often if early forms of precancerous polyps or small growths are found.  Skin Cancer  Check your skin from head to toe regularly.  Tell your health care provider about any new moles or changes in moles, especially if there is a change in a mole's shape or color.  Also tell your health care provider if you have a mole that is larger than the size of a pencil eraser.  Always use sunscreen. Apply sunscreen liberally and repeatedly throughout the day.  Protect yourself by wearing long sleeves, pants, a wide-brimmed hat, and sunglasses whenever you  are outside.  Heart disease, diabetes, and high blood pressure  High blood pressure causes heart disease and increases the risk of stroke. High blood pressure is more likely to develop in: ? People who have blood pressure in the high end of the normal range (130-139/85-89 mm Hg). ? People who are overweight or obese. ? People who are African American.  If you are 4-73 years of age, have your blood pressure checked every 3-5 years. If you are 64 years of age or older, have your blood pressure checked every year. You should have your blood pressure measured twice-once when you are at a hospital or clinic, and once when you are not at a hospital or clinic. Record the average of the two measurements. To check your blood pressure when you are not at a hospital or clinic, you can use: ? An automated blood pressure machine at a pharmacy. ? A home blood pressure monitor.  If you are between 33 years and 74 years old, ask your health care provider if you should take aspirin to prevent strokes.  Have regular diabetes  screenings. This involves taking a blood sample to check your fasting blood sugar level. ? If you are at a normal weight and have a low risk for diabetes, have this test once every three years after 48 years of age. ? If you are overweight and have a high risk for diabetes, consider being tested at a younger age or more often. Preventing infection Hepatitis B  If you have a higher risk for hepatitis B, you should be screened for this virus. You are considered at high risk for hepatitis B if: ? You were born in a country where hepatitis B is common. Ask your health care provider which countries are considered high risk. ? Your parents were born in a high-risk country, and you have not been immunized against hepatitis B (hepatitis B vaccine). ? You have HIV or AIDS. ? You use needles to inject street drugs. ? You live with someone who has hepatitis B. ? You have had sex with someone who has hepatitis B. ? You get hemodialysis treatment. ? You take certain medicines for conditions, including cancer, organ transplantation, and autoimmune conditions.  Hepatitis C  Blood testing is recommended for: ? Everyone born from [birth year] through 1967. ? Anyone with known risk factors for hepatitis C.  Sexually transmitted infections (STIs)  You should be screened for sexually transmitted infections (STIs) including gonorrhea and chlamydia if: ? You are sexually active and are younger than 48 years of age. ? You are older than 48 years of age and your health care provider tells you that you are at risk for this type of infection. ? Your sexual activity has changed since you were last screened and you are at an increased risk for chlamydia or gonorrhea. Ask your health care provider if you are at risk.  If you do not have HIV, but are at risk, it may be recommended that you take a prescription medicine daily to prevent HIV infection. This is called pre-exposure prophylaxis (PrEP). You are considered at risk  if: ? You are sexually active and do not regularly use condoms or know the HIV status of your partner(s). ? You take drugs by injection. ? You are sexually active with a partner who has HIV.  Talk with your health care provider about whether you are at high risk of being infected with HIV. If you choose to begin PrEP, you should first be  tested for HIV. You should then be tested every 3 months for as long as you are taking PrEP. Pregnancy  If you are premenopausal and you may become pregnant, ask your health care provider about preconception counseling.  If you may become pregnant, take 400 to 800 micrograms (mcg) of folic acid every day.  If you want to prevent pregnancy, talk to your health care provider about birth control (contraception). Osteoporosis and menopause  Osteoporosis is a disease in which the bones lose minerals and strength with aging. This can result in serious bone fractures. Your risk for osteoporosis can be identified using a bone density scan.  If you are 48 years of age or older, or if you are at risk for osteoporosis and fractures, ask your health care provider if you should be screened.  Ask your health care provider whether you should take a calcium or vitamin D supplement to lower your risk for osteoporosis.  Menopause may have certain physical symptoms and risks.  Hormone replacement therapy may reduce some of these symptoms and risks. Talk to your health care provider about whether hormone replacement therapy is right for you. Follow these instructions at home:  Schedule regular health, dental, and eye exams.  Stay current with your immunizations.  Do not use any tobacco products including cigarettes, chewing tobacco, or electronic cigarettes.  If you are pregnant, do not drink alcohol.  If you are breastfeeding, limit how much and how often you drink alcohol.  Limit alcohol intake to no more than 1 drink per day for nonpregnant women. One drink  equals 12 ounces of beer, 5 ounces of wine, or 1 ounces of hard liquor.  Do not use street drugs.  Do not share needles.  Ask your health care provider for help if you need support or information about quitting drugs.  Tell your health care provider if you often feel depressed.  Tell your health care provider if you have ever been abused or do not feel safe at home. This information is not intended to replace advice given to you by your health care provider. Make sure you discuss any questions you have with your health care provider. Document Released: 02/19/2011 Document Revised: 01/12/2016 Document Reviewed: 05/10/2015 Elsevier Interactive Patient Education  [employer].

## 2017-10-08 ENCOUNTER — Other Ambulatory Visit: Payer: Self-pay | Admitting: Gynecology

## 2017-10-08 ENCOUNTER — Encounter: Payer: Self-pay | Admitting: Women's Health

## 2017-10-08 DIAGNOSIS — Z1231 Encounter for screening mammogram for malignant neoplasm of breast: Secondary | ICD-10-CM

## 2017-10-30 ENCOUNTER — Ambulatory Visit
Admission: RE | Admit: 2017-10-30 | Discharge: 2017-10-30 | Disposition: A | Discharge status: Home / Self Care | Payer: Managed Care, Other (non HMO) | Source: Ambulatory Visit | Attending: Gynecology | Admitting: Gynecology

## 2017-10-30 DIAGNOSIS — Z1231 Encounter for screening mammogram for malignant neoplasm of breast: Secondary | ICD-10-CM

## 2017-10-31 ENCOUNTER — Encounter: Payer: Self-pay | Admitting: Physician Assistant

## 2017-11-19 ENCOUNTER — Encounter: Payer: Self-pay | Admitting: Physician Assistant

## 2017-11-19 ENCOUNTER — Ambulatory Visit: Payer: Managed Care, Other (non HMO) | Admitting: Physician Assistant

## 2017-11-19 VITALS — BP 120/78 | HR 86 | Ht 66.0 in | Wt 159.0 lb

## 2017-11-19 DIAGNOSIS — Z1211 Encounter for screening for malignant neoplasm of colon: Secondary | ICD-10-CM | POA: Diagnosis not present

## 2017-11-19 DIAGNOSIS — Z1212 Encounter for screening for malignant neoplasm of rectum: Secondary | ICD-10-CM

## 2017-11-19 DIAGNOSIS — Z800 Family history of malignant neoplasm of digestive organs: Secondary | ICD-10-CM | POA: Diagnosis not present

## 2017-11-19 MED ORDER — PEG 3350-KCL-NA BICARB-NACL 420 G PO SOLR: 4000.0000 mL | Freq: Once | ORAL | 0 refills | Status: AC

## 2017-11-19 NOTE — Patient Instructions (Signed)

## 2017-11-19 NOTE — Progress Notes (Signed)
Chief Complaint: Family history of colon cancer  HPI:    [name] is a 47 year old Caucasian female with a past medical history as listed below including a family history of colon cancer, who presented to the clinic today for discussion of a screening colonoscopy.    Today, explains she has no GI problems.  Her father was diagnosed with colon cancer in his early 4950s and passed away shortly after.  She has been told for "years" that she needs a colonoscopy but has always put this off due to fear of the prep.  She is ready now.    Denies fever, chills, blood in her stool, melena, weight loss, change in bowel habits, abdominal pain, heartburn, reflux or symptoms that awaken her at night.  Past Medical History:  Diagnosis Date  . Arthritis    spine  . Endometrial polyp   . Family history of adverse reaction to anesthesia    father-- ponv    Past Surgical History:  Procedure Laterality Date  . DILATATION & CURETTAGE/HYSTEROSCOPY WITH MYOSURE N/A 09/11/2016   Procedure: DILATATION & CURETTAGE/HYSTEROSCOPY WITH MYOSURE;  Surgeon: Dara Lordsimothy P Fontaine, MD;  Location: [facility];  Service: Gynecology;  Laterality: N/A;  . DILATION AND CURETTAGE OF UTERUS  1996    and  LEEP FOR CANCEROUS CELLS  . INTRAUTERINE DEVICE INSERTION  10/02/2016    Current Outpatient Medications  Medication Sig Dispense Refill  . Calcium Carbonate-Vitamin D (CALCIUM + D PO) Take 1 tablet by mouth daily.     Marland Kitchen. etonogestrel-ethinyl estradiol (NUVARING) 0.12-0.015 MG/24HR vaginal ring Insert vaginally and leave in place for 3 consecutive weeks, then remove for 1 week. 3 each 4  . Multiple Vitamin (MULTIVITAMIN) tablet Take 1 tablet by mouth daily.     No current facility-administered medications for this visit.     Allergies as of 11/19/2017 - Review Complete 11/19/2017  Allergen Reaction Noted  . Penicillins Hives and Swelling 09/13/2011    Family History  Problem Relation Age of Onset  .  Diabetes Father   . Cancer Father [id number]       COLON, throat cancer age 49   . Breast cancer Maternal Aunt        before 50  . Cancer Paternal Aunt        UNSURE OF TYPE OF CANCER  . Cancer Maternal Aunt        LUNG    Social History   Socioeconomic History  . Marital status: Legally Separated    Spouse name: Not on file  . Number of children: Not on file  . Years of education: Not on file  . Highest education level: Not on file  Occupational History  . Not on file  Social Needs  . Financial resource strain: Not on file  . Food insecurity:    Worry: Not on file    Inability: Not on file  . Transportation needs:    Medical: Not on file    Non-medical: Not on file  Tobacco Use  . Smoking status: Never Smoker  . Smokeless tobacco: Never Used  Substance and Sexual Activity  . Alcohol use: Yes    Comment: occassional  . Drug use: No  . Sexual activity: Never    Birth control/protection: None    Comment: INTERCOURSE AGE 89, SEXUAL PARTNERS LESS THAN 5  Mirena 10/02/2016  Lifestyle  . Physical activity:    Days per week: Not on file    Minutes per session: Not  on file  . Stress: Not on file  Relationships  . Social connections:    Talks on phone: Not on file    Gets together: Not on file    Attends religious service: Not on file    Active member of club or organization: Not on file    Attends meetings of clubs or organizations: Not on file    Relationship status: Not on file  . Intimate partner violence:    Fear of current or ex partner: Not on file    Emotionally abused: Not on file    Physically abused: Not on file    Forced sexual activity: Not on file  Other Topics Concern  . Not on file  Social History Narrative  . Not on file    Review of Systems:    Constitutional: No weight loss, fever or chills Skin: No rash Cardiovascular: No chest pain Respiratory: No SOB  Gastrointestinal: See HPI and otherwise negative Genitourinary: No dysuria  Neurological: No  headache Musculoskeletal: No new muscle or joint pain Hematologic: No bleeding Psychiatric: No history of depression or anxiety   Physical Exam:  Vital signs: BP 120/78   Pulse 86   Ht 5\' 6"  (1.676 m)   Wt 159 lb (72.1 kg)   SpO2 98%   BMI 25.66 kg/m   Constitutional:   Pleasant Caucasian female appears to be in NAD, Well developed, Well nourished, alert and cooperative Head:  Normocephalic and atraumatic. Eyes:   PEERL, EOMI. No icterus. Conjunctiva pink. Ears:  Normal auditory acuity. Neck:  Supple Throat: Oral cavity and pharynx without inflammation, swelling or lesion.  Respiratory: Respirations even and unlabored. Lungs clear to auscultation bilaterally.   No wheezes, crackles, or rhonchi.  Cardiovascular: Normal S1, S2. No MRG. Regular rate and rhythm. No peripheral edema, cyanosis or pallor.  Gastrointestinal:  Soft, nondistended, nontender. No rebound or guarding. Normal bowel sounds. No appreciable masses or hepatomegaly. Rectal:  Not performed.  Msk:  Symmetrical without gross deformities. Without edema, no deformity or joint abnormality.  Neurologic:  Alert and  oriented x4;  grossly normal neurologically.  Skin:   Dry and intact without significant lesions or rashes. Psychiatric: Demonstrates good judgement and reason without abnormal affect or behaviors.  NO recent labs.  Assessment: 1.  Family history of colon cancer: In her father in his early 8s 2.  Screening for colorectal cancer: Due to history above, patient is due a colonoscopy now  Plan: 1.  Scheduled patient for screening colonoscopy in the [facility] with Dr. Christella Hartigan.  Did discuss risk, benefits, limitations and alternatives and patient agrees to proceed. 2.  Patient to follow in clinic per recommendations from Dr. Christella Hartigan after time of procedure.  Hyacinth Meeker, PA-C [facility] 11/19/2017, 10:22 AM

## 2017-11-19 NOTE — Progress Notes (Signed)
I agree with the above note,, plan

## 2017-12-09 ENCOUNTER — Ambulatory Visit: Payer: Managed Care, Other (non HMO) | Admitting: Gynecology

## 2017-12-09 ENCOUNTER — Encounter: Payer: Self-pay | Admitting: Gynecology

## 2017-12-09 VITALS — BP 114/64

## 2017-12-09 DIAGNOSIS — B9689 Other specified bacterial agents as the cause of diseases classified elsewhere: Secondary | ICD-10-CM

## 2017-12-09 DIAGNOSIS — N760 Acute vaginitis: Secondary | ICD-10-CM | POA: Diagnosis not present

## 2017-12-09 LAB — WET PREP FOR TRICH, YEAST, CLUE

## 2017-12-09 MED ORDER — METRONIDAZOLE 500 MG PO TABS: 500.0000 mg | ORAL_TABLET | Freq: Two times a day (BID) | ORAL | 0 refills | Status: DC

## 2017-12-09 NOTE — Addendum Note (Signed)
Addended by: Dayna BarkerGARDNER, Debra Colon K on: 12/09/2017 01:59 PM   Modules accepted: Orders

## 2017-12-09 NOTE — Patient Instructions (Signed)
Take the Flagyl medication twice daily for 7 days.  Avoid alcohol while taking. 

## 2017-12-09 NOTE — Progress Notes (Signed)
    Cynthia Trevino 11/27/1970 [id number]        47 y.o.  G2P2 presents complaining of several days of vaginal irritation and itching.  Started using the NuvaRing and wonder whether it was related to this.  Used OTC Monistat x1 application but had a lot of irritation with this.  No odor or significant discharge.  No urinary symptoms such as frequency dysuria urgency low back pain fever or chills.  Past medical history,surgical history, problem list, medications, allergies, family history and social history were all reviewed and documented in the EPIC chart.  Directed ROS with pertinent positives and negatives documented in the history of present illness/assessment and plan.  Exam: [name] assistant Vitals:   12/09/17 1210  BP: 114/64   General appearance:  Normal Abdomen soft nontender without masses guarding rebound. Pelvic external BUS vagina normal with slight white discharge.  Bimanual exam without masses or tenderness.  Assessment/Plan:  47 y.o. G2P2 with history and exam as above.  Wet prep is negative.  Will treat as a low-grade bacterial vaginosis with Flagyl 500 mg twice daily times 7 days.  Alcohol avoidance reviewed.  Patient will follow-up if symptoms persist, worsen or recur.    Dara Lordsimothy P Brigitte Soderberg MD, 12:20 PM 12/09/2017

## 2018-01-10 ENCOUNTER — Encounter: Payer: Managed Care, Other (non HMO) | Admitting: Gastroenterology

## 2018-07-21 ENCOUNTER — Encounter: Payer: Self-pay | Admitting: Women's Health

## 2018-07-21 ENCOUNTER — Ambulatory Visit (INDEPENDENT_AMBULATORY_CARE_PROVIDER_SITE_OTHER): Payer: 59 | Admitting: Women's Health

## 2018-07-21 VITALS — BP 124/80 | Ht 66.0 in | Wt 166.0 lb

## 2018-07-21 DIAGNOSIS — Z13220 Encounter for screening for lipoid disorders: Secondary | ICD-10-CM

## 2018-07-21 DIAGNOSIS — Z01419 Encounter for gynecological examination (general) (routine) without abnormal findings: Secondary | ICD-10-CM | POA: Diagnosis not present

## 2018-07-21 DIAGNOSIS — Z1151 Encounter for screening for human papillomavirus (HPV): Secondary | ICD-10-CM

## 2018-07-21 DIAGNOSIS — N920 Excessive and frequent menstruation with regular cycle: Secondary | ICD-10-CM | POA: Diagnosis not present

## 2018-07-21 MED ORDER — ETONOGESTREL-ETHINYL ESTRADIOL 0.12-0.015 MG/24HR VA RING: VAGINAL_RING | VAGINAL | 4 refills | Status: DC

## 2018-07-21 MED ORDER — MEGESTROL ACETATE 40 MG PO TABS: 40.0000 mg | ORAL_TABLET | Freq: Two times a day (BID) | ORAL | 1 refills | Status: DC

## 2018-07-21 NOTE — Progress Notes (Signed)
Danise Minaracey A Heid 04/03/1971 [id number]    History:    Presents for annual exam.  Regular monthly cycles until approximately 5 years ago when cycles became heavier.  2017 endometrial polyp with hysteroscopy cycles persisted to be heavy after polypectomy.  06/2017 endometrial ablation for menorrhagia amenorrheic for several months and cycle started back greater than 6 months ago.  No relief with Mirena IUD, expulsed.   monthly heavy 3 day cycle/ Nuva Ring several months and then cycles lasting longer.  [facility] Mammogram history. Same partner, not sexually active.  1976 LEEP with normal Paps after .  Past medical history, past surgical history, family history and social history were all reviewed and documented in the EPIC chart. 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 Son is 5022, daughter is 6418 all doing well. Father has history of colon cancer age 49 and throat CA as well as benign eye tumor/survivor. Mother healthy.   ROS:  A ROS was performed and pertinent positives and negatives are included.  Exam:  Vitals:   07/21/18 1209  BP: 124/80  Weight: 166 lb (75.3 kg)  Height: 5\' 6"  (1.676 m)   Body mass index is 26.79 kg/m.   General appearance:  Normal Thyroid:  Symmetrical, normal in size, without palpable masses or nodularity. Respiratory  Auscultation:  Clear without wheezing or rhonchi Cardiovascular  Auscultation:  Regular rate, without rubs, murmurs or gallops  Edema/varicosities:  Not grossly evident Abdominal  Soft,nontender, without masses, guarding or rebound.  Liver/spleen:  No organomegaly noted  Hernia:  None appreciated  Skin  Inspection:  Grossly normal   Breasts: Examined lying and sitting.     Right: Without masses, retractions, discharge or axillary adenopathy.     Left: Without masses, retractions, discharge or axillary adenopathy. Gentitourinary   Inguinal/mons:  Normal without inguinal adenopathy  External genitalia:  Normal  BUS/Urethra/Skene's glands:   Normal  Vagina:  Normal  Cervix:  Normal  Uterus:   normal in size, shape and contour.  Midline and mobile  Adnexa/parametria:     Rt: Without masses or tenderness.   Lt: Without masses or tenderness.  Anus and perineum: Normal    Assessment/Plan:  47 y.o.  DWF G2P2 presents for annual exam.  Endometrial ablation, 06/2017 Contraception management   Plan: CBC, Pap with HR HPV typing, CBC, lipid screen, CMP,,TSH, SBE's, new screening guidelines reviewed.  Would like to speak with Dr. Audie BoxFontaine about possible hysterectomy due to persistent menorrhagia- will schedule.  Continue to use NuvaRing, prescription, proper use reviewed, a refill of Megace 40 mg p.o. twice daily as needed for menorrhagia given.  . Discussed use of condoms if sexually active. Discussed need to scheduled colonoscopy. Reviewed exercise, calcium rich diet, continue healthy lifestyle engouraged.  Harrington Challengerancy J Rayman Petrosian [facility] HospitalWHNP, 12:21 PM 07/21/2018

## 2018-07-21 NOTE — Patient Instructions (Addendum)
Health Maintenance, Female Adopting a healthy lifestyle and getting preventive care can go a long way to promote health and wellness. Talk with your health care provider about what schedule of regular examinations is right for you. This is a good chance for you to check in with your provider about disease prevention and staying healthy. In between checkups, there are plenty of things you can do on your own. Experts have done a lot of research about which lifestyle changes and preventive measures are most likely to keep you healthy. Ask your health care provider for more information. Weight and diet Eat a healthy diet  Be sure to include plenty of vegetables, fruits, low-fat dairy products, and lean protein.  Do not eat a lot of foods high in solid fats, added sugars, or salt.  Get regular exercise. This is one of the most important things you can do for your health. ? Most adults should exercise for at least 150 minutes each week. The exercise should increase your heart rate and make you sweat (moderate-intensity exercise). ? Most adults should also do strengthening exercises at least twice a week. This is in addition to the moderate-intensity exercise.  Maintain a healthy weight  Body mass index (BMI) is a measurement that can be used to identify possible weight problems. It estimates body fat based on height and weight. Your health care provider can help determine your BMI and help you achieve or maintain a healthy weight.  For females 20 years of age and older: ? A BMI below 18.5 is considered underweight. ? A BMI of 18.5 to 24.9 is normal. ? A BMI of 25 to 29.9 is considered overweight. ? A BMI of 30 and above is considered obese.  Watch levels of cholesterol and blood lipids  You should start having your blood tested for lipids and cholesterol at 49 years of age, then have this test every 5 years.  You may need to have your cholesterol levels checked more often if: ? Your lipid or  cholesterol levels are high. ? You are older than 50 years of age. ? You are at high risk for heart disease.  Cancer screening Lung Cancer  Lung cancer screening is recommended for adults 55-80 years old who are at high risk for lung cancer because of a history of smoking.  A yearly low-dose CT scan of the lungs is recommended for people who: ? Currently smoke. ? Have quit within the past 15 years. ? Have at least a 30-pack-year history of smoking. A pack year is smoking an average of one pack of cigarettes a day for 1 year.  Yearly screening should continue until it has been 15 years since you quit.  Yearly screening should stop if you develop a health problem that would prevent you from having lung cancer treatment.  Breast Cancer  Practice breast self-awareness. This means understanding how your breasts normally appear and feel.  It also means doing regular breast self-exams. Let your health care provider know about any changes, no matter how small.  If you are in your 20s or 30s, you should have a clinical breast exam (CBE) by a health care provider every 1-3 years as part of a regular health exam.  If you are 40 or older, have a CBE every year. Also consider having a breast X-ray (mammogram) every year.  If you have a family history of breast cancer, talk to your health care provider about genetic screening.  If you are at high risk   for breast cancer, talk to your health care provider about having an MRI and a mammogram every year.  Breast cancer gene (BRCA) assessment is recommended for women who have family members with BRCA-related cancers. BRCA-related cancers include: ? Breast. ? Ovarian. ? Tubal. ? Peritoneal cancers.  Results of the assessment will determine the need for genetic counseling and BRCA1 and BRCA2 testing.  Cervical Cancer Your health care provider may recommend that you be screened regularly for cancer of the pelvic organs (ovaries, uterus, and  vagina). This screening involves a pelvic examination, including checking for microscopic changes to the surface of your cervix (Pap test). You may be encouraged to have this screening done every 3 years, beginning at age 22.  For women ages 56-65, health care providers may recommend pelvic exams and Pap testing every 3 years, or they may recommend the Pap and pelvic exam, combined with testing for human papilloma virus (HPV), every 5 years. Some types of HPV increase your risk of cervical cancer. Testing for HPV may also be done on women of any age with unclear Pap test results.  Other health care providers may not recommend any screening for nonpregnant women who are considered low risk for pelvic cancer and who do not have symptoms. Ask your health care provider if a screening pelvic exam is right for you.  If you have had past treatment for cervical cancer or a condition that could lead to cancer, you need Pap tests and screening for cancer for at least 20 years after your treatment. If Pap tests have been discontinued, your risk factors (such as having a new sexual partner) need to be reassessed to determine if screening should resume. Some women have medical problems that increase the chance of getting cervical cancer. In these cases, your health care provider may recommend more frequent screening and Pap tests.  Colorectal Cancer  This type of cancer can be detected and often prevented.  Routine colorectal cancer screening usually begins at 49 years of age and continues through 49 years of age.  Your health care provider may recommend screening at an earlier age if you have risk factors for colon cancer.  Your health care provider may also recommend using home test kits to check for hidden blood in the stool.  A small camera at the end of a tube can be used to examine your colon directly (sigmoidoscopy or colonoscopy). This is done to check for the earliest forms of colorectal  cancer.  Routine screening usually begins at age 33.  Direct examination of the colon should be repeated every 5-10 years through 49 years of age. However, you may need to be screened more often if early forms of precancerous polyps or small growths are found.  Skin Cancer  Check your skin from head to toe regularly.  Tell your health care provider about any new moles or changes in moles, especially if there is a change in a mole's shape or color.  Also tell your health care provider if you have a mole that is larger than the size of a pencil eraser.  Always use sunscreen. Apply sunscreen liberally and repeatedly throughout the day.  Protect yourself by wearing long sleeves, pants, a wide-brimmed hat, and sunglasses whenever you are outside.  Heart disease, diabetes, and high blood pressure  High blood pressure causes heart disease and increases the risk of stroke. High blood pressure is more likely to develop in: ? People who have blood pressure in the high end of  the normal range (130-139/85-89 mm Hg). ? People who are overweight or obese. ? People who are African American.  If you are 21-29 years of age, have your blood pressure checked every 3-5 years. If you are 3 years of age or older, have your blood pressure checked every year. You should have your blood pressure measured twice-once when you are at a hospital or clinic, and once when you are not at a hospital or clinic. Record the average of the two measurements. To check your blood pressure when you are not at a hospital or clinic, you can use: ? An automated blood pressure machine at a pharmacy. ? A home blood pressure monitor.  If you are between 17 years and 37 years old, ask your health care provider if you should take aspirin to prevent strokes.  Have regular diabetes screenings. This involves taking a blood sample to check your fasting blood sugar level. ? If you are at a normal weight and have a low risk for diabetes,  have this test once every three years after 49 years of age. ? If you are overweight and have a high risk for diabetes, consider being tested at a younger age or more often. Preventing infection Hepatitis B  If you have a higher risk for hepatitis B, you should be screened for this virus. You are considered at high risk for hepatitis B if: ? You were born in a country where hepatitis B is common. Ask your health care provider which countries are considered high risk. ? Your parents were born in a high-risk country, and you have not been immunized against hepatitis B (hepatitis B vaccine). ? You have HIV or AIDS. ? You use needles to inject street drugs. ? You live with someone who has hepatitis B. ? You have had sex with someone who has hepatitis B. ? You get hemodialysis treatment. ? You take certain medicines for conditions, including cancer, organ transplantation, and autoimmune conditions.  Hepatitis C  Blood testing is recommended for: ? Everyone born from [birth year] through 1967. ? Anyone with known risk factors for hepatitis C.  Sexually transmitted infections (STIs)  You should be screened for sexually transmitted infections (STIs) including gonorrhea and chlamydia if: ? You are sexually active and are younger than 49 years of age. ? You are older than 49 years of age and your health care provider tells you that you are at risk for this type of infection. ? Your sexual activity has changed since you were last screened and you are at an increased risk for chlamydia or gonorrhea. Ask your health care provider if you are at risk.  If you do not have HIV, but are at risk, it may be recommended that you take a prescription medicine daily to prevent HIV infection. This is called pre-exposure prophylaxis (PrEP). You are considered at risk if: ? You are sexually active and do not regularly use condoms or know the HIV status of your partner(s). ? You take drugs by injection. ? You are  sexually active with a partner who has HIV.  Talk with your health care provider about whether you are at high risk of being infected with HIV. If you choose to begin PrEP, you should first be tested for HIV. You should then be tested every 3 months for as long as you are taking PrEP. Pregnancy  If you are premenopausal and you may become pregnant, ask your health care provider about preconception counseling.  If you may become  pregnant, take 400 to 800 micrograms (mcg) of folic acid every day.  If you want to prevent pregnancy, talk to your health care provider about birth control (contraception). Osteoporosis and menopause  Osteoporosis is a disease in which the bones lose minerals and strength with aging. This can result in serious bone fractures. Your risk for osteoporosis can be identified using a bone density scan.  If you are 53 years of age or older, or if you are at risk for osteoporosis and fractures, ask your health care provider if you should be screened.  Ask your health care provider whether you should take a calcium or vitamin D supplement to lower your risk for osteoporosis.  Menopause may have certain physical symptoms and risks.  Hormone replacement therapy may reduce some of these symptoms and risks. Talk to your health care provider about whether hormone replacement therapy is right for you. Follow these instructions at home:  Schedule regular health, dental, and eye exams.  Stay current with your immunizations.  Do not use any tobacco products including cigarettes, chewing tobacco, or electronic cigarettes.  If you are pregnant, do not drink alcohol.  If you are breastfeeding, limit how much and how often you drink alcohol.  Limit alcohol intake to no more than 1 drink per day for nonpregnant women. One drink equals 12 ounces of beer, 5 ounces of wine, or 1 ounces of hard liquor.  Do not use street drugs.  Do not share needles.  Ask your health care  provider for help if you need support or information about quitting drugs.  Tell your health care provider if you often feel depressed.  Tell your health care provider if you have ever been abused or do not feel safe at home. This information is not intended to replace advice given to you by your health care provider. Make sure you discuss any questions you have with your health care provider. Document Released: 02/19/2011 Document Revised: 01/12/2016 Document Reviewed: 05/10/2015 Elsevier Interactive Patient Education  2018 [place]. Laparoscopically Assisted Vaginal Hysterectomy, Care After Refer to this sheet in the next few weeks. These instructions provide you with information on caring for yourself after your procedure. Your health care provider may also give you more specific instructions. Your treatment has been planned according to current medical practices, but problems sometimes occur. Call your health care provider if you have any problems or questions after your procedure. What can I expect after the procedure? After your procedure, it is typical to have the following:  Abdominal pain. You will be given pain medicine to control it.  Sore throat from the breathing tube that was inserted during surgery.  Follow these instructions at home:  Only take over-the-counter or prescription medicines for pain, discomfort, or fever as directed by your health care provider.  Do not take aspirin. It can cause bleeding.  Do not drive when taking pain medicine.  Follow your health care provider's advice regarding diet, exercise, lifting, driving, and general activities.  Resume your usual diet as directed and allowed.  Get plenty of rest and sleep.  Do not douche, use tampons, or have sexual intercourse for at least 6 weeks, or until your health care provider gives you permission.  Change your bandages (dressings) as directed by your health care provider.  Monitor your  temperature and notify your health care provider of a fever.  Take showers instead of baths for 2-3 weeks.  Do not drink alcohol until your health care provider gives you  permission.  If you develop constipation, you may take a mild laxative with your health care provider's permission. Bran foods may help with constipation problems. Drinking enough fluids to keep your urine clear or pale yellow may help as well.  Try to have someone home with you for 1-2 weeks to help around the house.  Keep all of your follow-up appointments as directed by your health care provider. Contact a health care provider if:  You have swelling, redness, or increasing pain around your incision sites.  You have pus coming from your incision.  You notice a bad smell coming from your incision.  Your incision breaks open.  You feel dizzy or lightheaded.  You have pain or bleeding when you urinate.  You have persistent diarrhea.  You have persistent nausea and vomiting.  You have abnormal vaginal discharge.  You have a rash.  You have any type of abnormal reaction or develop an allergy to your medicine.  You have poor pain control with your prescribed medicine. Get help right away if:  You have a fever.  You have severe abdominal pain.  You have chest pain.  You have shortness of breath.  You faint.  You have pain, swelling, or redness in your leg.  You have heavy vaginal bleeding with blood clots. This information is not intended to replace advice given to you by your health care provider. Make sure you discuss any questions you have with your health care provider. Document Released: 07/26/2011 Document Revised: 01/12/2016 Document Reviewed: 02/19/2013 Elsevier Interactive Patient Education  2017 [place]. Menorrhagia Menorrhagia is a condition in which menstrual periods are heavy or last longer than normal. With menorrhagia, most periods a woman has may cause enough blood loss and  cramping that she becomes unable to take part in her usual activities. What are the causes? Common causes of this condition include:  Noncancerous growths in the uterus (polyps or fibroids).  An imbalance of the estrogen and progesterone hormones.  One of the ovaries not releasing an egg during one or more months.  A problem with the thyroid gland (hypothyroid).  Side effects of having an intrauterine device (IUD).  Side effects of some medicines, such as anti-inflammatory medicines or blood thinners.  A bleeding disorder that stops the blood from clotting normally.  In some cases, the cause of this condition is not known. What are the signs or symptoms? Symptoms of this condition include:  Routinely having to change your pad or tampon every 1-2 hours because it is completely soaked.  Needing to use pads and tampons at the same time because of heavy bleeding.  Needing to wake up to change your pads or tampons during the night.  Passing blood clots larger than 1 inch (2.5 cm) in size.  Having bleeding that lasts for more than 7 days.  Having symptoms of low iron levels (anemia), such as tiredness, fatigue, or shortness of breath.  How is this diagnosed? This condition may be diagnosed based on:  A physical exam.  Your symptoms and menstrual history.  Tests, such as: ? Blood tests to check if you are pregnant or have hormonal changes, a bleeding or thyroid disorder, anemia, or other problems. ? Pap test to check for cancerous changes, infections, or inflammation. ? Endometrial biopsy. This test involves removing a tissue sample from the lining of the uterus (endometrium) to be examined under a microscope. ? Pelvic ultrasound. This test uses sound waves to create images of your uterus, ovaries, and vagina.  The images can show if you have fibroids or other growths. ? Hysteroscopy. For this test, a small telescope is used to look inside your uterus.  How is this  treated? Treatment may not be needed for this condition. If it is needed, the best treatment for you will depend on:  Whether you need to prevent pregnancy.  Your desire to have children in the future.  The cause and severity of your bleeding.  Your personal preference.  Medicines are the first step in treatment. You may be treated with:  Hormonal birth control methods. These treatments reduce bleeding during your menstrual period. They include: ? Birth control pills. ? Skin patch. ? Vaginal ring. ? Shots (injections) that you get every 3 months. ? Hormonal IUD (intrauterine device). ? Implants that go under the skin.  Medicines that thicken blood and slow bleeding.  Medicines that reduce swelling, such as ibuprofen.  Medicines that contain an artificial (synthetic) hormone called progestin.  Medicines that make the ovaries stop working for a short time.  Iron supplements to treat anemia.  If medicines do not work, surgery may be done. Surgical options may include:  Dilation and curettage (D&C). In this procedure, your health care provider opens (dilates) your cervix and then scrapes or suctions tissue from the endometrium to reduce menstrual bleeding.  Operative hysteroscopy. In this procedure, a small tube with a light on the end (hysteroscope) is used to view your uterus and help remove polyps that may be causing heavy periods.  Endometrial ablation. This is when various techniques are used to permanently destroy your entire endometrium. After endometrial ablation, most women have little or no menstrual flow. This procedure reduces your ability to become pregnant.  Endometrial resection. In this procedure, an electrosurgical wire loop is used to remove the endometrium. This procedure reduces your ability to become pregnant.  Hysterectomy. This is surgical removal of the uterus. This is a permanent procedure that stops menstrual periods. Pregnancy is not possible after a  hysterectomy.  Follow these instructions at home: Medicines  Take over-the-counter and prescription medicines exactly as told by your health care provider. This includes iron pills.  Do not change or switch medicines without asking your health care provider.  Do not take aspirin or medicines that contain aspirin 1 week before or during your menstrual period. Aspirin may make bleeding worse. General instructions  If you need to change your sanitary pad or tampon more than once every 2 hours, limit your activity until the bleeding stops.  Iron pills can cause constipation. To prevent or treat constipation while you are taking prescription iron supplements, your health care provider may recommend that you: ? Drink enough fluid to keep your urine clear or pale yellow. ? Take over-the-counter or prescription medicines. ? Eat foods that are high in fiber, such as fresh fruits and vegetables, whole grains, and beans. ? Limit foods that are high in fat and processed sugars, such as fried and sweet foods.  Eat well-balanced meals, including foods that are high in iron. Foods that have a lot of iron include leafy green vegetables, meat, liver, eggs, and whole grain breads and cereals.  Do not try to lose weight until the abnormal bleeding has stopped and your blood iron level is back to normal. If you need to lose weight, work with your health care provider to lose weight safely.  Keep all follow-up visits as told by your health care provider. This is important. Contact a health care provider if:  You soak through a pad or tampon every 1 or 2 hours, and this happens every time you have a period.  You need to use pads and tampons at the same time because you are bleeding so much.  You have nausea, vomiting, diarrhea, or other problems related to medicines you are taking. Get help right away if:  You soak through more than a pad or tampon in 1 hour.  You pass clots bigger than 1 inch (2.5 cm)  wide.  You feel short of breath.  You feel like your heart is beating too fast.  You feel dizzy or faint.  You feel very weak or tired. Summary  Menorrhagia is a condition in which menstrual periods are heavy or last longer than normal.  Treatment will depend on the cause of the condition and may include medicines or procedures.  Take over-the-counter and prescription medicines exactly as told by your health care provider. This includes iron pills.  Get help right away if you have heavy bleeding that soaks through more than a pad or tampon in 1 hour, you are passing large clots, or you feel dizzy, faint or short of breath. This information is not intended to replace advice given to you by your health care provider. Make sure you discuss any questions you have with your health care provider. Document Released: 08/06/2005 Document Revised: 07/30/2016 Document Reviewed: 07/30/2016 Elsevier Interactive Patient Education  [employer].

## 2018-07-22 LAB — COMPREHENSIVE METABOLIC PANEL
AG RATIO: 1.7 (calc) (ref 1.0–2.5)
ALT: 16 U/L (ref 6–29)
AST: 16 U/L (ref 10–35)
Albumin: 4.2 g/dL (ref 3.6–5.1)
Alkaline phosphatase (APISO): 46 U/L (ref 33–115)
BILIRUBIN TOTAL: 0.5 mg/dL (ref 0.2–1.2)
BUN: 7 mg/dL (ref 7–25)
CALCIUM: 9.1 mg/dL (ref 8.6–10.2)
CHLORIDE: 103 mmol/L (ref 98–110)
CO2: 23 mmol/L (ref 20–32)
Creat: 0.81 mg/dL (ref 0.50–1.10)
GLOBULIN: 2.5 g/dL (ref 1.9–3.7)
Glucose, Bld: 91 mg/dL (ref 65–99)
POTASSIUM: 4 mmol/L (ref 3.5–5.3)
Sodium: 137 mmol/L (ref 135–146)
Total Protein: 6.7 g/dL (ref 6.1–8.1)

## 2018-07-22 LAB — LIPID PANEL
Cholesterol: 222 mg/dL — ABNORMAL HIGH (ref <200)
HDL: 49 mg/dL — ABNORMAL LOW (ref >50)
LDL Cholesterol (Calc): 137 mg/dL (calc) — ABNORMAL HIGH
NON-HDL CHOLESTEROL (CALC): 173 mg/dL — AB (ref <130)
TRIGLYCERIDES: 216 mg/dL — AB (ref <150)
Total CHOL/HDL Ratio: 4.5 (calc) (ref <5.0)

## 2018-07-22 LAB — CBC WITH DIFFERENTIAL/PLATELET
BASOS ABS: 47 {cells}/uL (ref 0–200)
BASOS PCT: 0.6 %
EOS ABS: 245 {cells}/uL (ref 15–500)
Eosinophils Relative: 3.1 %
HCT: 38.8 % (ref 35.0–45.0)
Hemoglobin: 13.2 g/dL (ref 11.7–15.5)
Lymphs Abs: 2686 cells/uL (ref 850–3900)
MCH: 32.3 pg (ref 27.0–33.0)
MCHC: 34 g/dL (ref 32.0–36.0)
MCV: 94.9 fL (ref 80.0–100.0)
MPV: 9.7 fL (ref 7.5–12.5)
Monocytes Relative: 6.2 %
Neutro Abs: 4432 cells/uL (ref 1500–7800)
Neutrophils Relative %: 56.1 %
PLATELETS: 300 10*3/uL (ref 140–400)
RBC: 4.09 10*6/uL (ref 3.80–5.10)
RDW: 12 % (ref 11.0–15.0)
TOTAL LYMPHOCYTE: 34 %
WBC mixed population: 490 cells/uL (ref 200–950)
WBC: 7.9 10*3/uL (ref 3.8–10.8)

## 2018-07-22 LAB — PAP, TP IMAGING W/ HPV RNA, RFLX HPV TYPE 16,18/45: HPV DNA High Risk: NOT DETECTED

## 2018-07-22 LAB — TSH: TSH: 1.92 m[IU]/L

## 2018-08-15 ENCOUNTER — Ambulatory Visit: Payer: 59 | Admitting: Gynecology

## 2018-09-16 ENCOUNTER — Ambulatory Visit: Payer: 59 | Admitting: Gynecology

## 2019-05-13 ENCOUNTER — Encounter: Payer: Self-pay | Admitting: Gynecology

## 2019-09-07 ENCOUNTER — Other Ambulatory Visit: Payer: Self-pay

## 2019-09-08 ENCOUNTER — Ambulatory Visit (INDEPENDENT_AMBULATORY_CARE_PROVIDER_SITE_OTHER): Payer: 59 | Admitting: Women's Health

## 2019-09-08 ENCOUNTER — Encounter: Payer: Self-pay | Admitting: Women's Health

## 2019-09-08 VITALS — BP 122/80 | Ht 66.0 in | Wt 173.0 lb

## 2019-09-08 DIAGNOSIS — Z01419 Encounter for gynecological examination (general) (routine) without abnormal findings: Secondary | ICD-10-CM

## 2019-09-08 DIAGNOSIS — Z13220 Encounter for screening for lipoid disorders: Secondary | ICD-10-CM | POA: Diagnosis not present

## 2019-09-08 LAB — CBC WITH DIFFERENTIAL/PLATELET
Absolute Monocytes: 489 cells/uL (ref 200–950)
Basophils Absolute: 51 cells/uL (ref 0–200)
Basophils Relative: 0.7 %
Eosinophils Absolute: 183 cells/uL (ref 15–500)
Eosinophils Relative: 2.5 %
HCT: 39.7 % (ref 35.0–45.0)
Hemoglobin: 13.7 g/dL (ref 11.7–15.5)
Lymphs Abs: 2540 cells/uL (ref 850–3900)
MCH: 32.2 pg (ref 27.0–33.0)
MCHC: 34.5 g/dL (ref 32.0–36.0)
MCV: 93.2 fL (ref 80.0–100.0)
MPV: 9.5 fL (ref 7.5–12.5)
Monocytes Relative: 6.7 %
Neutro Abs: 4037 cells/uL (ref 1500–7800)
Neutrophils Relative %: 55.3 %
Platelets: 271 10*3/uL (ref 140–400)
RBC: 4.26 10*6/uL (ref 3.80–5.10)
RDW: 12.2 % (ref 11.0–15.0)
Total Lymphocyte: 34.8 %
WBC: 7.3 10*3/uL (ref 3.8–10.8)

## 2019-09-08 LAB — COMPREHENSIVE METABOLIC PANEL
AG Ratio: 1.8 (calc) (ref 1.0–2.5)
ALT: 13 U/L (ref 6–29)
AST: 11 U/L (ref 10–35)
Albumin: 4.5 g/dL (ref 3.6–5.1)
Alkaline phosphatase (APISO): 48 U/L (ref 37–153)
BUN: 14 mg/dL (ref 7–25)
CO2: 24 mmol/L (ref 20–32)
Calcium: 9.5 mg/dL (ref 8.6–10.4)
Chloride: 102 mmol/L (ref 98–110)
Creat: 0.76 mg/dL (ref 0.50–1.05)
Globulin: 2.5 g/dL (calc) (ref 1.9–3.7)
Glucose, Bld: 89 mg/dL (ref 65–99)
Potassium: 4.1 mmol/L (ref 3.5–5.3)
Sodium: 138 mmol/L (ref 135–146)
Total Bilirubin: 0.5 mg/dL (ref 0.2–1.2)
Total Protein: 7 g/dL (ref 6.1–8.1)

## 2019-09-08 LAB — LIPID PANEL
Cholesterol: 211 mg/dL — ABNORMAL HIGH (ref <200)
HDL: 56 mg/dL (ref >=50)
LDL Cholesterol (Calc): 136 mg/dL (calc) — ABNORMAL HIGH
Non-HDL Cholesterol (Calc): 155 mg/dL (calc) — ABNORMAL HIGH (ref <130)
Total CHOL/HDL Ratio: 3.8 (calc) (ref <5.0)
Triglycerides: 88 mg/dL (ref <150)

## 2019-09-08 NOTE — Patient Instructions (Addendum)
Vit d 2000 iu Good to see you today! Colonoscopy  [phone]  [facility] Dr Carlean Purl  Health Maintenance for Postmenopausal Women Menopause is a normal process in which your ability to get pregnant comes to an end. This process happens slowly over many months or years, usually between the ages of 18 and 36. Menopause is complete when you have missed your menstrual periods for 12 months. It is important to talk with your health care provider about some of the most common conditions that affect women after menopause (postmenopausal women). These include heart disease, cancer, and bone loss (osteoporosis). Adopting a healthy lifestyle and getting preventive care can help to promote your health and wellness. The actions you take can also lower your chances of developing some of these common conditions. What should I know about menopause? During menopause, you may get a number of symptoms, such as:  Hot flashes. These can be moderate or severe.  Night sweats.  Decrease in sex drive.  Mood swings.  Headaches.  Tiredness.  Irritability.  Memory problems.  Insomnia. Choosing to treat or not to treat these symptoms is a decision that you make with your health care provider. Do I need hormone replacement therapy?  Hormone replacement therapy is effective in treating symptoms that are caused by menopause, such as hot flashes and night sweats.  Hormone replacement carries certain risks, especially as you become older. If you are thinking about using estrogen or estrogen with progestin, discuss the benefits and risks with your health care provider. What is my risk for heart disease and stroke? The risk of heart disease, heart attack, and stroke increases as you age. One of the causes may be a change in the body's hormones during menopause. This can affect how your body uses dietary fats, triglycerides, and cholesterol. Heart attack and stroke are medical emergencies. There are many things that you  can do to help prevent heart disease and stroke. Watch your blood pressure  High blood pressure causes heart disease and increases the risk of stroke. This is more likely to develop in people who have high blood pressure readings, are of African descent, or are overweight.  Have your blood pressure checked: ? Every 3-5 years if you are 74-80 years of age. ? Every year if you are 42 years old or older. Eat a healthy diet   Eat a diet that includes plenty of vegetables, fruits, low-fat dairy products, and lean protein.  Do not eat a lot of foods that are high in solid fats, added sugars, or sodium. Get regular exercise Get regular exercise. This is one of the most important things you can do for your health. Most adults should:  Try to exercise for at least 150 minutes each week. The exercise should increase your heart rate and make you sweat (moderate-intensity exercise).  Try to do strengthening exercises at least twice each week. Do these in addition to the moderate-intensity exercise.  Spend less time sitting. Even light physical activity can be beneficial. Other tips  Work with your health care provider to achieve or maintain a healthy weight.  Do not use any products that contain nicotine or tobacco, such as cigarettes, e-cigarettes, and chewing tobacco. If you need help quitting, ask your health care provider.  Know your numbers. Ask your health care provider to check your cholesterol and your blood sugar (glucose). Continue to have your blood tested as directed by your health care provider. Do I need screening for cancer? Depending on your health  history and family history, you may need to have cancer screening at different stages of your life. This may include screening for:  Breast cancer.  Cervical cancer.  Lung cancer.  Colorectal cancer. What is my risk for osteoporosis? After menopause, you may be at increased risk for osteoporosis. Osteoporosis is a condition in  which bone destruction happens more quickly than new bone creation. To help prevent osteoporosis or the bone fractures that can happen because of osteoporosis, you may take the following actions:  If you are 64-24 years old, get at least 1,000 mg of calcium and at least 600 mg of vitamin D per day.  If you are older than age 86 but younger than age 26, get at least 1,200 mg of calcium and at least 600 mg of vitamin D per day.  If you are older than age 80, get at least 1,200 mg of calcium and at least 800 mg of vitamin D per day. Smoking and drinking excessive alcohol increase the risk of osteoporosis. Eat foods that are rich in calcium and vitamin D, and do weight-bearing exercises several times each week as directed by your health care provider. How does menopause affect my mental health? Depression may occur at any age, but it is more common as you become older. Common symptoms of depression include:  Low or sad mood.  Changes in sleep patterns.  Changes in appetite or eating patterns.  Feeling an overall lack of motivation or enjoyment of activities that you previously enjoyed.  Frequent crying spells. Talk with your health care provider if you think that you are experiencing depression. General instructions See your health care provider for regular wellness exams and vaccines. This may include:  Scheduling regular health, dental, and eye exams.  Getting and maintaining your vaccines. These include: ? Influenza vaccine. Get this vaccine each year before the flu season begins. ? Pneumonia vaccine. ? Shingles vaccine. ? Tetanus, diphtheria, and pertussis (Tdap) booster vaccine. Your health care provider may also recommend other immunizations. Tell your health care provider if you have ever been abused or do not feel safe at home. Summary  Menopause is a normal process in which your ability to get pregnant comes to an end.  This condition causes hot flashes, night sweats,  decreased interest in sex, mood swings, headaches, or lack of sleep.  Treatment for this condition may include hormone replacement therapy.  Take actions to keep yourself healthy, including exercising regularly, eating a healthy diet, watching your weight, and checking your blood pressure and blood sugar levels.  Get screened for cancer and depression. Make sure that you are up to date with all your vaccines. This information is not intended to replace advice given to you by your health care provider. Make sure you discuss any questions you have with your health care provider. Document Revised: 07/30/2018 Document Reviewed: 07/30/2018 Elsevier Patient Education  2020 [employer].

## 2019-09-08 NOTE — Progress Notes (Signed)
   History: Presents for annual exam. Amenorrheic x 1 yr, some hot flashes at night. Boyfriend x2 yrs, not sexually active, his health, declines need for STD screen. Denies vaginal bleeding, spotting, discharge, odor, or urinary symptoms. LEEP 1976, normal paps since, . Normal mammogram history, last in 2019. No colonoscopy scheduled. Takes vitamin D daily. 15 lb weight gain in past year. 2017 endometrial polyp with hysteroscopy and polypectomy. 06/2017 endometrial ablation for menorrhagia, recurred after 6 months, trial Mirena and expulsed. No other medical problems, no PCP. Stressed in past year, cared for sick mother (49) at home before placing in assisted living due to needing 24 hours 7 days a week care, following a stroke.   Past medical history, past surgical history, family history and social history were all reviewed and documented in the EPIC chart. Designer, industrial/product, looking for new job. Son and daughter doing well. Father history of colon cancer age 41.  ROS:  A ROS was performed and pertinent positives and negatives are included.  Exam:  Vitals:   09/08/19 1105  BP: 122/80  Weight: 173 lb (78.5 kg)  Height: 5\' 6"  (1.676 m)   Body mass index is 27.92 kg/m.   General appearance:  Normal Thyroid:  Symmetrical, normal in size, without palpable masses or nodularity. Respiratory  Auscultation:  Clear without wheezing or rhonchi Cardiovascular  Auscultation:  Regular rate, without rubs, murmurs or gallops  Edema/varicosities:  Not grossly evident Abdominal  Soft,nontender, without masses, guarding or rebound.  Liver/spleen:  No organomegaly noted  Hernia:  None appreciated  Skin  Inspection:  Grossly normal   Breasts: Examined lying and sitting.     Right: Without masses, retractions, discharge or axillary adenopathy.     Left: Without masses, retractions, discharge or axillary adenopathy. Gentitourinary   Inguinal/mons:  Normal without inguinal adenopathy  External  genitalia:  Normal  BUS/Urethra/Skene's glands:  Normal  Vagina:  Normal  Cervix:  Normal  Uterus:  normal in size, shape and contour.  Midline and mobile  Adnexa/parametria:     Rt: Without masses or tenderness.   Lt: Without masses or tenderness.  Anus and perineum: Normal    Assessment/Plan:  49 y.o. DWF G2P2 for annual exam with no complaints.   Postmenopausal- no bleeding/no HRT 1976 LEEP with normal Paps after  Plan: relief of menorrhagia with menopause, will call if hot flashes interfere with sleep quality. Discussed stroke risk of future HRT and increased risk due to family history. Recommended increase in cardio exercise and decrease carbs/calories. Continue daily vitamin D, 2000iu daily. Encouraged condoms if become sexually active.  SBEs, annual screening mammogram, overdue instructed to schedule.  Instructions given to schedule colonoscopy at [facility].  Pap normal 2019 with negative HR HPV typing, new screening guidelines reviewed.  Reviewed importance of self-care and leisure activities.  CBC, CMP, lipid panel pending.     2020 [facility], 11:36 AM 09/08/2019

## 2021-02-03 DIAGNOSIS — N951 Menopausal and female climacteric states: Secondary | ICD-10-CM | POA: Diagnosis not present

## 2021-06-27 ENCOUNTER — Other Ambulatory Visit: Payer: Self-pay | Admitting: *Deleted

## 2021-06-27 ENCOUNTER — Other Ambulatory Visit: Payer: Self-pay

## 2021-06-27 DIAGNOSIS — Z1231 Encounter for screening mammogram for malignant neoplasm of breast: Secondary | ICD-10-CM

## 2021-07-06 ENCOUNTER — Other Ambulatory Visit: Payer: Self-pay

## 2021-07-06 ENCOUNTER — Ambulatory Visit
Admission: RE | Admit: 2021-07-06 | Discharge: 2021-07-06 | Disposition: A | Discharge status: Home / Self Care | Payer: BC Managed Care – PPO | Source: Ambulatory Visit | Attending: *Deleted | Admitting: *Deleted

## 2021-07-06 DIAGNOSIS — Z1231 Encounter for screening mammogram for malignant neoplasm of breast: Secondary | ICD-10-CM

## 2021-09-19 ENCOUNTER — Ambulatory Visit (INDEPENDENT_AMBULATORY_CARE_PROVIDER_SITE_OTHER): Payer: BC Managed Care – PPO | Admitting: Nurse Practitioner

## 2021-09-19 ENCOUNTER — Encounter: Payer: Self-pay | Admitting: Nurse Practitioner

## 2021-09-19 ENCOUNTER — Other Ambulatory Visit: Payer: Self-pay

## 2021-09-19 VITALS — BP 118/78 | Ht 65.0 in | Wt 185.0 lb

## 2021-09-19 DIAGNOSIS — Z01419 Encounter for gynecological examination (general) (routine) without abnormal findings: Secondary | ICD-10-CM | POA: Diagnosis not present

## 2021-09-19 DIAGNOSIS — Z79890 Hormone replacement therapy (postmenopausal): Secondary | ICD-10-CM | POA: Diagnosis not present

## 2021-09-19 MED ORDER — PREMPRO 0.625-2.5 MG PO TABS: 1.0000 | ORAL_TABLET | Freq: Every day | ORAL | 3 refills | Status: DC

## 2021-09-19 NOTE — Progress Notes (Signed)
° °  Cynthia Trevino [birth year] [id number]   History:  51 y.o. G2P2 presents for annual exam. Postmenopausal - on HRT prescribed by PCP 2 months ago for menopausal symptoms. She has had much improvement in symptoms and wants to continue. 1976 LEEP, normal paps since.   Gynecologic History No LMP recorded. (Menstrual status: Other).   Contraception/Family planning: post menopausal status Sexually active: Yes  Health Maintenance Last Pap: 07/21/2018. Results were: Normal, 5-year repeat Last mammogram: 07/06/2021. Results were: Normal Last colonoscopy: Never Last Dexa: Not indicated  Past medical history, past surgical history, family history and social history were all reviewed and documented in the EPIC chart. 34 yo daughter, in college. Son lives local. Father with history of colon cancer at age 39.   ROS:  A ROS was performed and pertinent positives and negatives are included.  Exam:  Vitals:   09/19/21 1619  BP: 118/78  Weight: 185 lb (83.9 kg)  Height: 5\' 5"  (1.651 m)   Body mass index is 30.79 kg/m.  General appearance:  Normal Thyroid:  Symmetrical, normal in size, without palpable masses or nodularity. Respiratory  Auscultation:  Clear without wheezing or rhonchi Cardiovascular  Auscultation:  Regular rate, without rubs, murmurs or gallops  Edema/varicosities:  Not grossly evident Abdominal  Soft,nontender, without masses, guarding or rebound.  Liver/spleen:  No organomegaly noted  Hernia:  None appreciated  Skin  Inspection:  Grossly normal Breasts: Examined lying and sitting.   Right: Without masses, retractions, nipple discharge or axillary adenopathy.   Left: Without masses, retractions, nipple discharge or axillary adenopathy. Genitourinary   Inguinal/mons:  Normal without inguinal adenopathy  External genitalia:  Normal appearing vulva with no masses, tenderness, or lesions  BUS/Urethra/Skene's glands:  Normal  Vagina:  Normal appearing with normal color  and discharge, no lesions  Cervix:  Normal appearing without discharge or lesions  Uterus:  Normal in size, shape and contour.  Midline and mobile, nontender  Adnexa/parametria:     Rt: Normal in size, without masses or tenderness.   Lt: Normal in size, without masses or tenderness.  Anus and perineum: Normal  Digital rectal exam: Normal sphincter tone without palpated masses or tenderness  Patient informed chaperone available to be present for breast and pelvic exam. Patient has requested no chaperone to be present. Patient has been advised what will be completed during breast and pelvic exam.   Assessment/Plan:  51 y.o. G2P2 for annual exam.   Well female exam with routine gynecological exam - Education provided on SBEs, importance of preventative screenings, current guidelines, high calcium diet, regular exercise, and multivitamin daily. Labs with PCP.  Postmenopausal hormone therapy - Plan: PREMPRO 0.625-2.5 MG tablet daily for menopausal symptoms. Prescribed by PCP originally with recommendations to follow up with 44 for refills. She has had much improvement in symptoms. Aware of risk for blood clots, heart attack, stroke, and breast cancer. She wants to continue. Refill x 1 year provided.   Screening for cervical cancer - 1976 LEEP, normal paps since. Will repeat at 5-year interval per guidelines.  Screening for breast cancer - Normal mammogram history.  Continue annual screenings.  Normal breast exam today.  Screening for colon cancer - Has not had colonoscopy. Father with history of colon cancer at age 37. PCP sent referral per patient.   Return in 1 year for annual.     59 DNP, 4:53 PM 09/19/2021

## 2021-12-21 ENCOUNTER — Ambulatory Visit (INDEPENDENT_AMBULATORY_CARE_PROVIDER_SITE_OTHER): Payer: Self-pay | Admitting: Nurse Practitioner

## 2021-12-21 VITALS — BP 118/74

## 2021-12-21 DIAGNOSIS — R300 Dysuria: Secondary | ICD-10-CM

## 2021-12-21 DIAGNOSIS — N3001 Acute cystitis with hematuria: Secondary | ICD-10-CM

## 2021-12-21 MED ORDER — SULFAMETHOXAZOLE-TRIMETHOPRIM 800-160 MG PO TABS: 1.0000 | ORAL_TABLET | Freq: Two times a day (BID) | ORAL | 0 refills | Status: AC

## 2021-12-21 NOTE — Progress Notes (Signed)
? ?  Acute Office Visit ? ?Subjective:  ? ? Patient ID: Cynthia Trevino, female    DOB: Feb 08, 1971, 51 y.o.   MRN: [id number] ? ? ?HPI ?51 y.o. presents today for dysuria, hematuria, and lower abdominal pressure.  ? ? ?Review of Systems  ?Constitutional: Negative.   ?Genitourinary:  Positive for dysuria, hematuria and pelvic pain (Pressure/discomfort). Negative for decreased urine volume, difficulty urinating, flank pain, frequency and urgency.  ? ?   ?Objective:  ?  ?Physical Exam ?Constitutional:   ?   Appearance: Normal appearance.  ?Abdominal:  ?   Tenderness: There is no right CVA tenderness or left CVA tenderness.  ?GU: not indicated ? ?BP 118/74  ?Wt Readings from Last 3 Encounters:  ?09/19/21 185 lb (83.9 kg)  ?09/08/19 173 lb (78.5 kg)  ?07/21/18 166 lb (75.3 kg)  ? ? ?   ? ?UA: 1+ leukocytes, nitrite positive, blood 3+, Ketone 1+, dark yellow/clear. Microscopic: wbc 10-20, rbc 20-40, many bacteria ? ?Assessment & Plan:  ? ?Problem List Items Addressed This Visit   ?None ?Visit Diagnoses   ? ? Acute cystitis with hematuria    -  Primary  ? Relevant Medications  ? sulfamethoxazole-trimethoprim (BACTRIM DS) 800-160 MG tablet  ? Dysuria      ? Relevant Orders  ? Urinalysis,Complete w/RFL Culture (Completed)  ? Urine Culture  ? REFLEXIVE URINE CULTURE (Completed)  ? ?  ? ?Plan: Acute cystitis - Bactrim 800-160 mg BID x 3 days. Culture pending. Increase water intake, AZO as needed.  ? ? ? ? ?Olivia Mackie DNP, 7:59 PM 12/21/2021 ? ?

## 2021-12-23 LAB — URINALYSIS, COMPLETE W/RFL CULTURE
Bilirubin Urine: NEGATIVE
Casts: NONE SEEN /LPF
Crystals: NONE SEEN /HPF
Glucose, UA: NEGATIVE
Hyaline Cast: NONE SEEN /LPF
Nitrites, Initial: POSITIVE — AB
Protein, ur: NEGATIVE
Specific Gravity, Urine: 1.025 (ref 1.001–1.035)
Yeast: NONE SEEN /HPF
pH: 5.5 (ref 5.0–8.0)

## 2021-12-23 LAB — URINE CULTURE
MICRO NUMBER:: 13351916
SPECIMEN QUALITY:: ADEQUATE

## 2021-12-23 LAB — CULTURE INDICATED

## 2022-03-05 ENCOUNTER — Other Ambulatory Visit: Payer: Self-pay | Admitting: Nurse Practitioner

## 2022-03-05 ENCOUNTER — Telehealth: Payer: Self-pay | Admitting: *Deleted

## 2022-03-05 DIAGNOSIS — Z79890 Hormone replacement therapy (postmenopausal): Secondary | ICD-10-CM

## 2022-03-05 MED ORDER — PROGESTERONE MICRONIZED 100 MG PO CAPS: 100.0000 mg | ORAL_CAPSULE | Freq: Every evening | ORAL | 1 refills | Status: DC

## 2022-03-05 MED ORDER — ESTRADIOL 1 MG PO TABS: 1.0000 mg | ORAL_TABLET | Freq: Every day | ORAL | 1 refills | Status: DC

## 2022-03-05 NOTE — Telephone Encounter (Signed)
Patient informed she is willing to try oral estradiol and progesterone. If you send please send to [facility].

## 2022-03-05 NOTE — Telephone Encounter (Signed)
Please see if she is agreeable to switch to Estradiol in pill or patch form and oral prometrium.

## 2022-03-05 NOTE — Telephone Encounter (Signed)
Per [employer] The request for coverage for PREMPRO TAB, use as directed (28 per 28 days), is denied.  This decision is based on health plan criteria for PREMPRO TAB. This medicine is covered only if: You have failed or cannot use medroxyprogesterone (generic Provera) used in combination with estradiol (generic Estrace). The information provided does not show that you meet the criteria listed above.

## 2022-03-05 NOTE — Telephone Encounter (Signed)
PA done via cover my meds for Prempro 0.625 mg-2.5 mg tablet, pending response from [employer].

## 2022-09-07 IMAGING — MG MM DIGITAL SCREENING BILAT W/ TOMO AND CAD
8 series · 8 of 24 positions shown · non-contrast
Comparison: Previous exam(s).

CLINICAL DATA: Screening.

EXAM:
DIGITAL SCREENING BILATERAL MAMMOGRAM WITH TOMOSYNTHESIS AND CAD
TECHNIQUE: Bilateral screening digital craniocaudal and mediolateral oblique
mammograms were obtained. Bilateral screening digital breast
tomosynthesis was performed. The images were evaluated with
computer-aided detection.

[R MLO synth-2D]
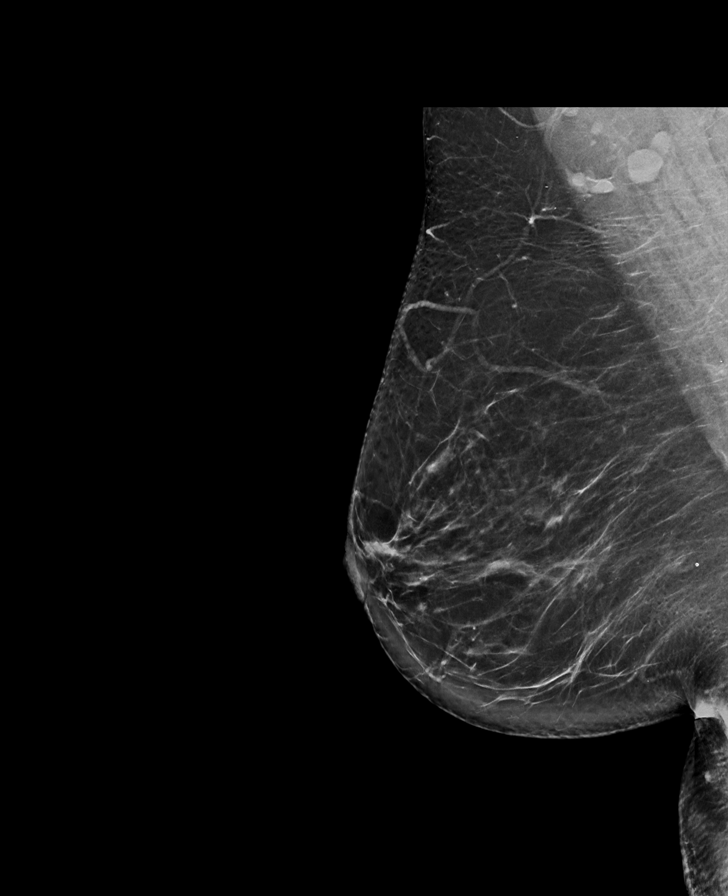

[L CC synth-2D]
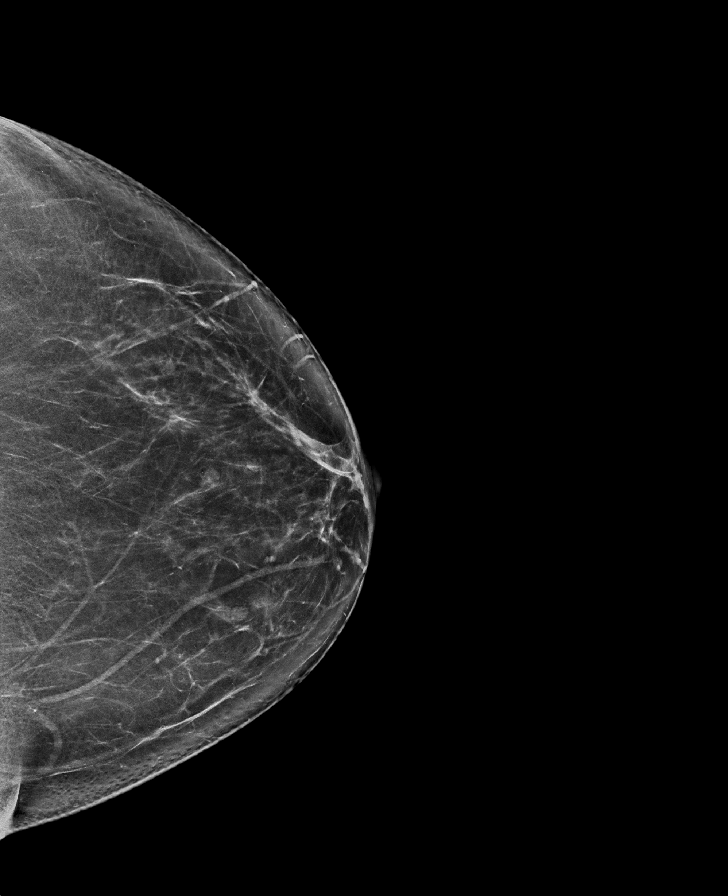

[R CC synth-2D]
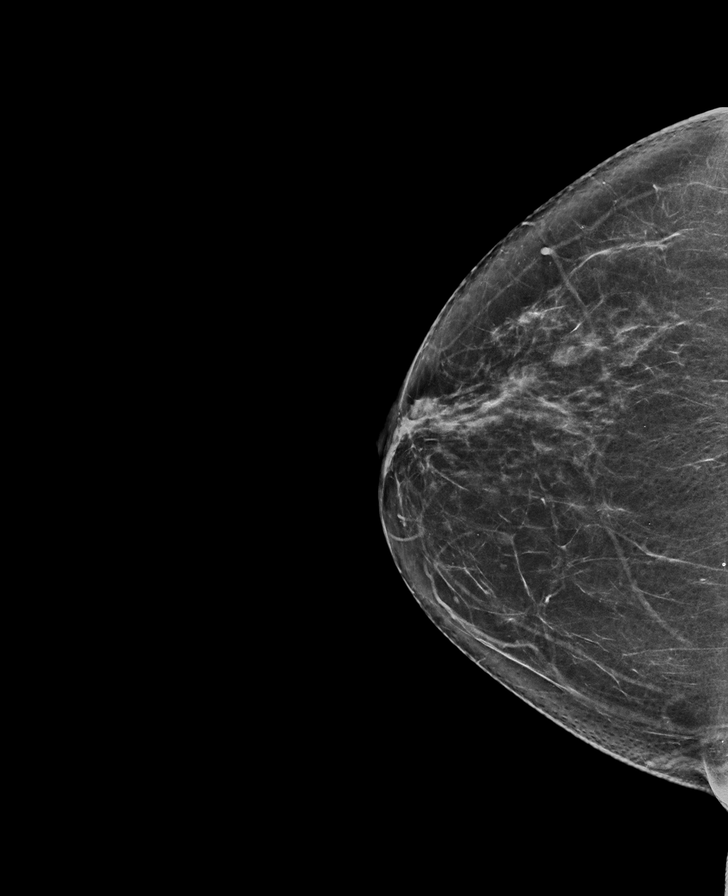

[L MLO synth-2D]
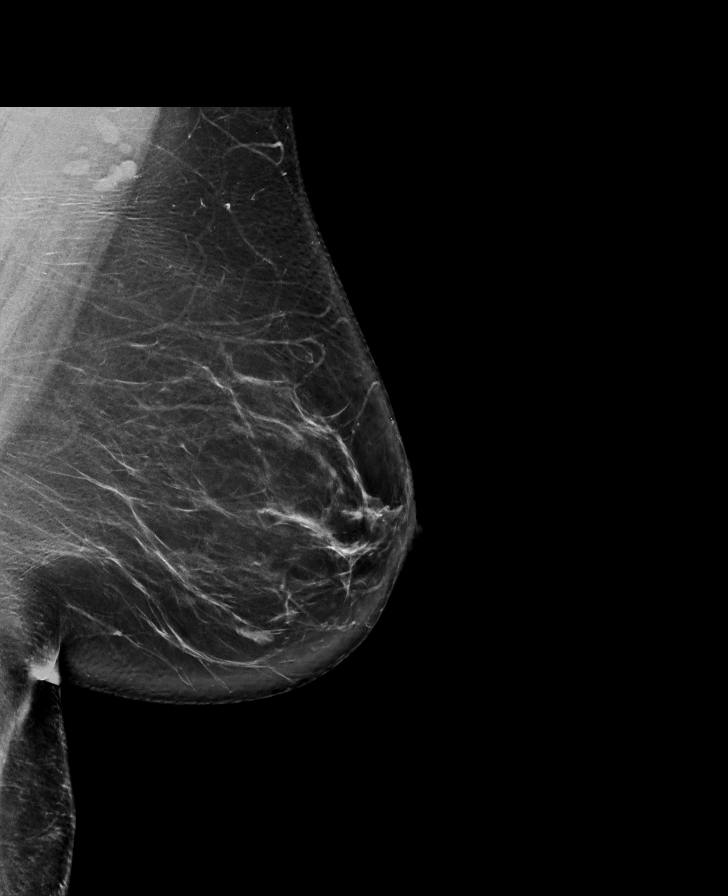

[R CC tomo · tomo slice 36/71.0]
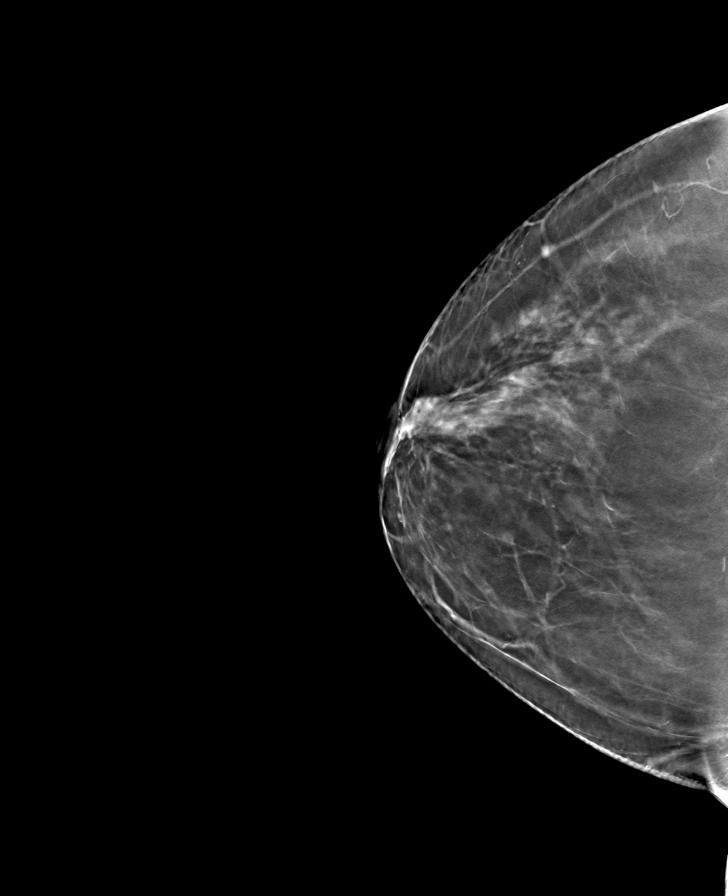

[L CC tomo · tomo slice 39/77.0]
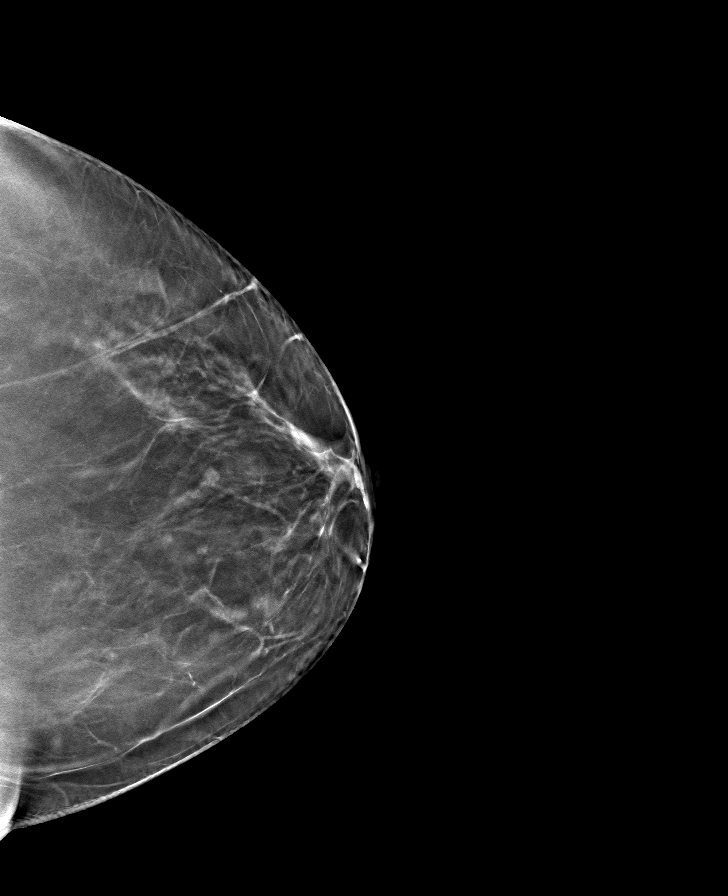

[R MLO tomo · tomo slice 44/87.0]
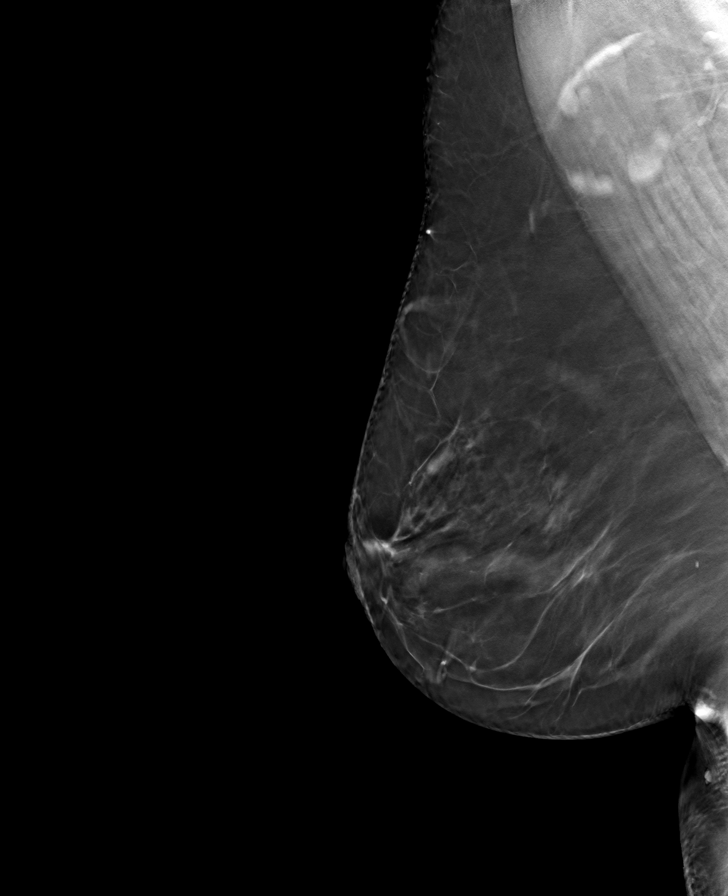

[L MLO tomo · tomo slice 47/92.0]
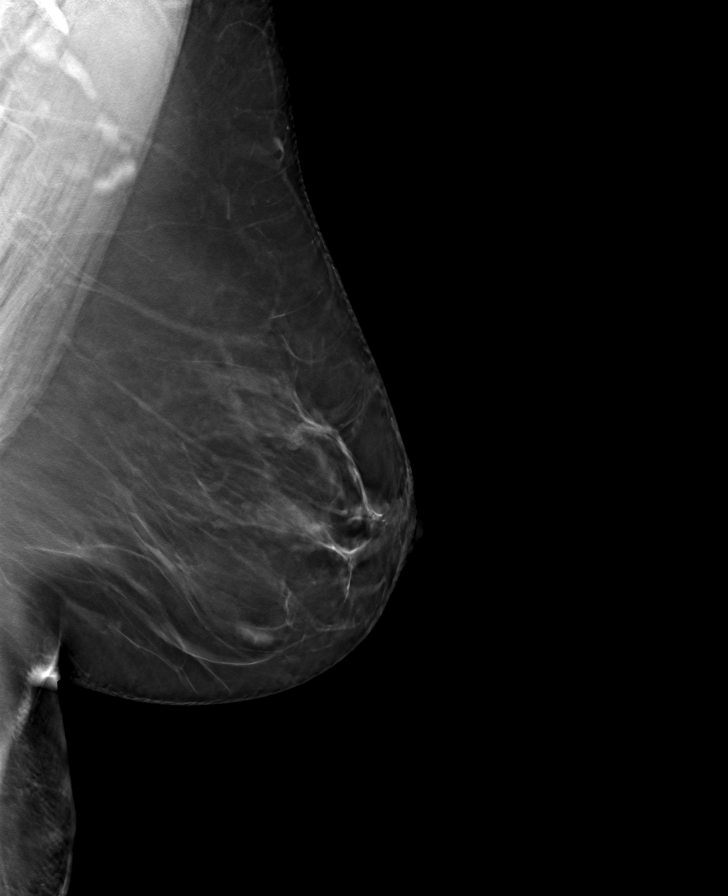

[8 of 24 positions shown; findings below may reference images not displayed]

ACR Breast Density Category b: There are scattered areas of
fibroglandular density.
FINDINGS: There are no findings suspicious for malignancy.
IMPRESSION: No mammographic evidence of malignancy. A result letter of this
screening mammogram will be mailed directly to the patient.

RECOMMENDATION:
Screening mammogram in one year. (Code:51-O-LD2)

BI-RADS CATEGORY  1: Negative.

## 2022-10-16 ENCOUNTER — Encounter: Payer: Self-pay | Admitting: Nurse Practitioner

## 2022-10-30 ENCOUNTER — Encounter: Payer: Self-pay | Admitting: Nurse Practitioner

## 2022-10-30 ENCOUNTER — Other Ambulatory Visit (HOSPITAL_COMMUNITY)
Admission: RE | Admit: 2022-10-30 | Discharge: 2022-10-30 | Disposition: A | Discharge status: Home / Self Care | Payer: 59 | Source: Ambulatory Visit | Attending: Nurse Practitioner | Admitting: Nurse Practitioner

## 2022-10-30 ENCOUNTER — Ambulatory Visit (INDEPENDENT_AMBULATORY_CARE_PROVIDER_SITE_OTHER): Payer: 59 | Admitting: Nurse Practitioner

## 2022-10-30 VITALS — BP 124/84 | HR 82 | Ht 65.0 in | Wt 188.0 lb

## 2022-10-30 DIAGNOSIS — Z124 Encounter for screening for malignant neoplasm of cervix: Secondary | ICD-10-CM

## 2022-10-30 DIAGNOSIS — Z79890 Hormone replacement therapy (postmenopausal): Secondary | ICD-10-CM

## 2022-10-30 DIAGNOSIS — E785 Hyperlipidemia, unspecified: Secondary | ICD-10-CM

## 2022-10-30 DIAGNOSIS — Z01419 Encounter for gynecological examination (general) (routine) without abnormal findings: Secondary | ICD-10-CM

## 2022-10-30 MED ORDER — ESTRADIOL 0.0375 MG/24HR TD PTWK: 0.0375 mg | MEDICATED_PATCH | TRANSDERMAL | 3 refills | Status: DC

## 2022-10-30 MED ORDER — PROGESTERONE MICRONIZED 100 MG PO CAPS: 100.0000 mg | ORAL_CAPSULE | Freq: Every evening | ORAL | 3 refills | Status: DC

## 2022-10-30 NOTE — Patient Instructions (Signed)
Schedule Colonoscopy! ?[facility] ?[phone] ?[address] [place], [place] [address] ? ?

## 2022-10-30 NOTE — Progress Notes (Signed)
Cynthia Trevino 1971/07/29 [id number]   History:  52 y.o. G2P2 presents for annual exam. Postmenopausal - stopped HRT 6-9 months ago because she felt Estradiol 1 mg was causing anxiety. Experiencing significant hot flashes and overall fatigue. Would like to restart. 1976 LEEP, normal paps since.   Gynecologic History No LMP recorded (lmp unknown). Patient is postmenopausal.   Contraception/Family planning: post menopausal status Sexually active: Yes  Health Maintenance Last Pap: 07/21/2018. Results were: Normal neg HPV, 5-year repeat Last mammogram: 07/06/2021. Results were: Normal Last colonoscopy: Never Last Dexa: Not indicated  Past medical history, past surgical history, family history and social history were all reviewed and documented in the EPIC chart. Boyfriend. 71 yo daughter. 82 yo son living with her. Father with history of colon cancer at age 28.   ROS:  A ROS was performed and pertinent positives and negatives are included.  Exam:  Vitals:   10/30/22 1002  BP: 124/84  Pulse: 82  SpO2: 97%  Weight: 188 lb (85.3 kg)  Height: '5\' 5"'$  (1.651 m)    Body mass index is 31.28 kg/m.  General appearance:  Normal Thyroid:  Symmetrical, normal in size, without palpable masses or nodularity. Respiratory  Auscultation:  Clear without wheezing or rhonchi Cardiovascular  Auscultation:  Regular rate, without rubs, murmurs or gallops  Edema/varicosities:  Not grossly evident Abdominal  Soft,nontender, without masses, guarding or rebound.  Liver/spleen:  No organomegaly noted  Hernia:  None appreciated  Skin  Inspection:  Grossly normal Breasts: Examined lying and sitting.   Right: Without masses, retractions, nipple discharge or axillary adenopathy.   Left: Without masses, retractions, nipple discharge or axillary adenopathy. Genitourinary   Inguinal/mons:  Normal without inguinal adenopathy  External genitalia:  Normal appearing vulva with no masses, tenderness, or  lesions  BUS/Urethra/Skene's glands:  Normal  Vagina:  Normal appearing with normal color and discharge, no lesions  Cervix:  Normal appearing without discharge or lesions  Uterus:  Normal in size, shape and contour.  Midline and mobile, nontender  Adnexa/parametria:     Rt: Normal in size, without masses or tenderness.   Lt: Normal in size, without masses or tenderness.  Anus and perineum: Normal  Digital rectal exam: Deferred  Patient informed chaperone available to be present for breast and pelvic exam. Patient has requested no chaperone to be present. Patient has been advised what will be completed during breast and pelvic exam.   Assessment/Plan:  52 y.o. G2P2 for annual exam.   Well female exam with routine gynecological exam - Plan: CBC with Differential/Platelet, Comprehensive metabolic panel. Education provided on SBEs, importance of preventative screenings, current guidelines, high calcium diet, regular exercise, and multivitamin daily.   Postmenopausal hormone therapy - Plan: estradiol (CLIMARA) 0.0375 mg/24hr patch weekly, progesterone (PROMETRIUM) 100 MG capsule nightly. Wants to restart HRT due to significant hot flashes and fatigue. Educated on proper use, risks and benefits.   Screening for cervical cancer - Plan: Cytology - PAP( [place] [facility]). 1976 LEEP, normal paps since.   Hyperlipidemia, unspecified hyperlipidemia type - Plan: Lipid panel  Screening for breast cancer - Normal mammogram history. Overdue and stressed importance of annual screenings, especially with HRT use. She plans to call today to schedule.  Normal breast exam today.  Screening for colon cancer - Has not had colonoscopy. Information provided on [facility]. Father with history of colon cancer at age 52.  Return in 1 year for annual.     Tamela Gammon DNP, 10:28 AM 10/30/2022

## 2022-10-31 LAB — CBC WITH DIFFERENTIAL/PLATELET
Absolute Monocytes: 482 cells/uL (ref 200–950)
Basophils Absolute: 73 cells/uL (ref 0–200)
Basophils Relative: 1 %
Eosinophils Absolute: 241 cells/uL (ref 15–500)
Eosinophils Relative: 3.3 %
HCT: 40.1 % (ref 35.0–45.0)
Hemoglobin: 14.1 g/dL (ref 11.7–15.5)
Lymphs Abs: 2767 cells/uL (ref 850–3900)
MCH: 31.9 pg (ref 27.0–33.0)
MCHC: 35.2 g/dL (ref 32.0–36.0)
MCV: 90.7 fL (ref 80.0–100.0)
MPV: 9.7 fL (ref 7.5–12.5)
Monocytes Relative: 6.6 %
Neutro Abs: 3738 cells/uL (ref 1500–7800)
Neutrophils Relative %: 51.2 %
Platelets: 301 10*3/uL (ref 140–400)
RBC: 4.42 10*6/uL (ref 3.80–5.10)
RDW: 12.2 % (ref 11.0–15.0)
Total Lymphocyte: 37.9 %
WBC: 7.3 10*3/uL (ref 3.8–10.8)

## 2022-10-31 LAB — COMPREHENSIVE METABOLIC PANEL
AG Ratio: 1.7 (calc) (ref 1.0–2.5)
ALT: 23 U/L (ref 6–29)
AST: 15 U/L (ref 10–35)
Albumin: 4.6 g/dL (ref 3.6–5.1)
Alkaline phosphatase (APISO): 51 U/L (ref 37–153)
BUN: 13 mg/dL (ref 7–25)
CO2: 26 mmol/L (ref 20–32)
Calcium: 9.6 mg/dL (ref 8.6–10.4)
Chloride: 107 mmol/L (ref 98–110)
Creat: 0.87 mg/dL (ref 0.50–1.03)
Globulin: 2.7 g/dL (calc) (ref 1.9–3.7)
Glucose, Bld: 104 mg/dL — ABNORMAL HIGH (ref 65–99)
Potassium: 4.9 mmol/L (ref 3.5–5.3)
Sodium: 143 mmol/L (ref 135–146)
Total Bilirubin: 0.4 mg/dL (ref 0.2–1.2)
Total Protein: 7.3 g/dL (ref 6.1–8.1)

## 2022-10-31 LAB — LIPID PANEL
Cholesterol: 250 mg/dL — ABNORMAL HIGH (ref <200)
HDL: 60 mg/dL (ref >=50)
LDL Cholesterol (Calc): 161 mg/dL (calc) — ABNORMAL HIGH
Non-HDL Cholesterol (Calc): 190 mg/dL (calc) — ABNORMAL HIGH (ref <130)
Total CHOL/HDL Ratio: 4.2 (calc) (ref <5.0)
Triglycerides: 144 mg/dL (ref <150)

## 2022-11-01 LAB — CYTOLOGY - PAP
Comment: NEGATIVE
Diagnosis: NEGATIVE
High risk HPV: NEGATIVE

## 2022-11-07 ENCOUNTER — Other Ambulatory Visit: Payer: Self-pay | Admitting: Nurse Practitioner

## 2022-11-07 DIAGNOSIS — Z1231 Encounter for screening mammogram for malignant neoplasm of breast: Secondary | ICD-10-CM

## 2022-11-12 ENCOUNTER — Ambulatory Visit
Admission: RE | Admit: 2022-11-12 | Discharge: 2022-11-12 | Disposition: A | Discharge status: Home / Self Care | Payer: 59 | Source: Ambulatory Visit | Attending: Nurse Practitioner | Admitting: Nurse Practitioner

## 2022-11-12 DIAGNOSIS — Z1231 Encounter for screening mammogram for malignant neoplasm of breast: Secondary | ICD-10-CM

## 2023-07-12 ENCOUNTER — Encounter: Payer: Self-pay | Admitting: Internal Medicine

## 2023-07-26 ENCOUNTER — Ambulatory Visit (AMBULATORY_SURGERY_CENTER): Payer: 59

## 2023-07-26 VITALS — Ht 65.0 in | Wt 182.0 lb

## 2023-07-26 DIAGNOSIS — Z800 Family history of malignant neoplasm of digestive organs: Secondary | ICD-10-CM

## 2023-07-26 DIAGNOSIS — Z1211 Encounter for screening for malignant neoplasm of colon: Secondary | ICD-10-CM

## 2023-07-26 MED ORDER — NA SULFATE-K SULFATE-MG SULF 17.5-3.13-1.6 GM/177ML PO SOLN: 1.0000 | Freq: Once | ORAL | 0 refills | Status: DC

## 2023-07-26 NOTE — Progress Notes (Signed)

## 2023-07-28 ENCOUNTER — Other Ambulatory Visit: Payer: Self-pay | Admitting: Internal Medicine

## 2023-07-28 DIAGNOSIS — Z1211 Encounter for screening for malignant neoplasm of colon: Secondary | ICD-10-CM

## 2023-07-28 DIAGNOSIS — Z800 Family history of malignant neoplasm of digestive organs: Secondary | ICD-10-CM

## 2023-07-30 ENCOUNTER — Encounter: Payer: Self-pay | Admitting: Internal Medicine

## 2023-08-07 ENCOUNTER — Encounter: Payer: Self-pay | Admitting: Certified Registered Nurse Anesthetist

## 2023-08-07 NOTE — Progress Notes (Unsigned)
[facility] History and Physical   Primary Care Physician:  Patient, No Pcp Per   Reason for Procedure:   Family history of colon cancer  Plan:    colonoscopy     HPI: Cynthia Trevino is a 52 y.o. female presenting for a screening colonoscopy - she has a FHx CRCA in father in 4's   Past Medical History:  Diagnosis Date   Arthritis    spine   Endometrial polyp    Family history of adverse reaction to anesthesia    father-- ponv    Past Surgical History:  Procedure Laterality Date   DILATATION & CURETTAGE/HYSTEROSCOPY WITH MYOSURE N/A 09/11/2016   Procedure: DILATATION & CURETTAGE/HYSTEROSCOPY WITH MYOSURE;  Surgeon: Dara Lords, MD;  Location: [facility];  Service: Gynecology;  Laterality: N/A;   DILATION AND CURETTAGE OF UTERUS  1996    and  LEEP FOR CANCEROUS CELLS   INTRAUTERINE DEVICE INSERTION  10/02/2016    Prior to Admission medications   Medication Sig Start Date End Date Taking? Authorizing Provider  BIOTIN PO Take by mouth. Patient not taking: Reported on 07/26/2023    [provider]  Calcium Carbonate-Vitamin D (CALCIUM + D PO) Take 1 tablet by mouth daily.  Patient not taking: Reported on 07/26/2023    [provider]  EPINEPHrine 0.3 mg/0.3 mL IJ SOAJ injection Inject 0.3 mg into the muscle as needed. 04/06/23   [provider]  estradiol (CLIMARA) 0.0375 mg/24hr patch Place 1 patch (0.0375 mg total) onto the skin once a week. 10/30/22   Olivia Mackie, NP  Multiple Vitamin (MULTIVITAMIN) tablet Take 1 tablet by mouth daily. Patient not taking: Reported on 07/26/2023    [provider]  progesterone (PROMETRIUM) 100 MG capsule Take 1 capsule (100 mg total) by mouth at bedtime. 10/30/22   Olivia Mackie, NP    Current Outpatient Medications  Medication Sig Dispense Refill   estradiol (CLIMARA) 0.0375 mg/24hr patch Place 1 patch (0.0375 mg total) onto the skin once a week. 12 patch 3    Calcium Carbonate-Vitamin D (CALCIUM + D PO) Take 1 tablet by mouth daily.  (Patient not taking: Reported on 08/08/2023)     EPINEPHrine 0.3 mg/0.3 mL IJ SOAJ injection Inject 0.3 mg into the muscle as needed.     Multiple Vitamin (MULTIVITAMIN) tablet Take 1 tablet by mouth daily. (Patient not taking: Reported on 08/08/2023)     progesterone (PROMETRIUM) 100 MG capsule Take 1 capsule (100 mg total) by mouth at bedtime. (Patient not taking: Reported on 08/08/2023) 90 capsule 3   Current Facility-Administered Medications  Medication Dose Route Frequency Provider Last Rate Last Admin   0.9 %  sodium chloride infusion  500 mL Intravenous Continuous Iva Boop, MD        Allergies as of 08/08/2023 - Review Complete 08/08/2023  Allergen Reaction Noted   Penicillins Hives, Swelling, and Rash 09/13/2011    Family History  Problem Relation Age of Onset   Colon polyps Father    Colon cancer Father 76       colon cancer x 2   Diabetes Father    Cancer Father 89       COLON, throat cancer age 35    Breast cancer Maternal Aunt        before 50   Cancer Maternal Aunt        LUNG   Cancer Paternal Aunt        UNSURE OF  TYPE OF CANCER   Esophageal cancer Neg Hx    Rectal cancer Neg Hx    Stomach cancer Neg Hx     Social History   Socioeconomic History   Marital status: Divorced    Spouse name: Not on file   Number of children: Not on file   Years of education: Not on file   Highest education level: Not on file  Occupational History   Not on file  Tobacco Use   Smoking status: Never   Smokeless tobacco: Never  Vaping Use   Vaping status: Never Used  Substance and Sexual Activity   Alcohol use: Yes    Comment: occassional   Drug use: No   Sexual activity: Yes    Partners: Male    Birth control/protection: Post-menopausal    Comment: First IC >16y/o  Other Topics Concern   Not on file  Social History Narrative   Not on file   Social Drivers of Health   Financial  Resource Strain: Not on file  Food Insecurity: Not on file  Transportation Needs: Not on file  Physical Activity: Not on file  Stress: Not on file  Social Connections: Not on file  Intimate Partner Violence: Not on file    Review of Systems:  All other review of systems negative except as mentioned in the HPI.  Physical Exam: Vital signs BP (!) 139/93   Pulse 86   Temp 98 F (36.7 C) (Skin)   Resp 12   Ht 5\' 5"  (1.651 m)   Wt 182 lb (82.6 kg)   LMP  (LMP Unknown)   SpO2 100%   BMI 30.29 kg/m   General:   Alert,  Well-developed, well-nourished, pleasant and cooperative in NAD Lungs:  Clear throughout to auscultation.   Heart:  Regular rate and rhythm; no murmurs, clicks, rubs,  or gallops. Abdomen:  Soft, nontender and nondistended. Normal bowel sounds.   Neuro/Psych:  Alert and cooperative. Normal mood and affect. A and O x 3   @Aurelius Gildersleeve  Sena Slate, MD, [facility] [phone] (pager) 08/08/2023 11:10 AM@

## 2023-08-08 ENCOUNTER — Ambulatory Visit: Payer: 59 | Admitting: Internal Medicine

## 2023-08-08 ENCOUNTER — Encounter: Payer: Self-pay | Admitting: Internal Medicine

## 2023-08-08 VITALS — BP 115/77 | HR 68 | Temp 98.0°F | Resp 14 | Ht 65.0 in | Wt 182.0 lb

## 2023-08-08 DIAGNOSIS — Z800 Family history of malignant neoplasm of digestive organs: Secondary | ICD-10-CM

## 2023-08-08 DIAGNOSIS — D128 Benign neoplasm of rectum: Secondary | ICD-10-CM | POA: Diagnosis not present

## 2023-08-08 DIAGNOSIS — Z1211 Encounter for screening for malignant neoplasm of colon: Secondary | ICD-10-CM

## 2023-08-08 DIAGNOSIS — K5730 Diverticulosis of large intestine without perforation or abscess without bleeding: Secondary | ICD-10-CM | POA: Diagnosis not present

## 2023-08-08 MED ORDER — SODIUM CHLORIDE 0.9 % IV SOLN: 500.0000 mL | INTRAVENOUS | Status: DC

## 2023-08-08 NOTE — Progress Notes (Signed)
Called to room to assist during endoscopic procedure.  Patient ID and intended procedure confirmed with present staff. Received instructions for my participation in the procedure from the performing physician.  

## 2023-08-08 NOTE — Patient Instructions (Addendum)
I removed one small polyp from the rectum.  You also have a condition called diverticulosis - common and not usually a problem. Please read the handout provided.   I will let you know pathology results and when to have another routine colonoscopy by mail and/or My Chart. Iva Boop, MD, FACG  YOU HAD AN ENDOSCOPIC PROCEDURE TODAY AT THE [facility]:   Refer to the procedure report that was given to you for any specific questions about what was found during the examination.  If the procedure report does not answer your questions, please call your gastroenterologist to clarify.  If you requested that your care partner not be given the details of your procedure findings, then the procedure report has been included in a sealed envelope for you to review at your convenience later.  YOU SHOULD EXPECT: Some feelings of bloating in the abdomen. Passage of more gas than usual.  Walking can help get rid of the air that was put into your GI tract during the procedure and reduce the bloating. If you had a lower endoscopy (such as a colonoscopy or flexible sigmoidoscopy) you may notice spotting of blood in your stool or on the toilet paper. If you underwent a bowel prep for your procedure, you may not have a normal bowel movement for a few days.  Please Note:  You might notice some irritation and congestion in your nose or some drainage.  This is from the oxygen used during your procedure.  There is no need for concern and it should clear up in a day or so.  SYMPTOMS TO REPORT IMMEDIATELY:  Following lower endoscopy (colonoscopy or flexible sigmoidoscopy):  Excessive amounts of blood in the stool  Significant tenderness or worsening of abdominal pains  Swelling of the abdomen that is new, acute  Fever of 100F or higher  For urgent or emergent issues, a gastroenterologist can be reached at any hour by calling [phone]. Do not use MyChart messaging for urgent concerns.    DIET:   We do recommend a small meal at first, but then you may proceed to your regular diet.  Drink plenty of fluids but you should avoid alcoholic beverages for 24 hours.  ACTIVITY:  You should plan to take it easy for the rest of today and you should NOT DRIVE or use heavy machinery until tomorrow (because of the sedation medicines used during the test).    FOLLOW UP: Our staff will call the number listed on your records the next business day following your procedure.  We will call around 7:15- 8:00 am to check on you and address any questions or concerns that you may have regarding the information given to you following your procedure. If we do not reach you, we will leave a message.     If any biopsies were taken you will be contacted by phone or by letter within the next 1-3 weeks.  Please call us at [phone] if you have not heard about the biopsies in 3 weeks.    SIGNATURES/CONFIDENTIALITY: You and/or your care partner have signed paperwork which will be entered into your electronic medical record.  These signatures attest to the fact that that the information above on your After Visit Summary has been reviewed and is understood.  Full responsibility of the confidentiality of this discharge information lies with you and/or your care-partner.

## 2023-08-08 NOTE — Op Note (Signed)
[facility] Patient Name: Cynthia Trevino Procedure Date: 08/08/2023 11:05 AM MRN: [id number] Endoscopist: Iva Boop , MD, [id number] Age: 52 Referring MD:  Date of Birth: 09-17-1970 Gender: Female Account #: [id number] Procedure:                Colonoscopy Indications:              Screening in patient at increased risk: Colorectal                            cancer in father before age 37 Medicines:                Monitored Anesthesia Care Procedure:                Pre-Anesthesia Assessment:                           - Prior to the procedure, a History and Physical                            was performed, and patient medications and                            allergies were reviewed. The patient's tolerance of                            previous anesthesia was also reviewed. The risks                            and benefits of the procedure and the sedation                            options and risks were discussed with the patient.                            All questions were answered, and informed consent                            was obtained. Prior Anticoagulants: The patient has                            taken no anticoagulant or antiplatelet agents. ASA                            Grade Assessment: I - A normal, healthy patient.                            After reviewing the risks and benefits, the patient                            was deemed in satisfactory condition to undergo the                            procedure.  After obtaining informed consent, the colonoscope                            was passed under direct vision. Throughout the                            procedure, the patient's blood pressure, pulse, and                            oxygen saturations were monitored continuously. The                            Olympus CF-HQ190L ([id number]) Colonoscope was                            introduced through the anus and advanced  to the the                            cecum, identified by appendiceal orifice and                            ileocecal valve. The colonoscopy was performed                            without difficulty. The patient tolerated the                            procedure well. The quality of the bowel                            preparation was excellent. The ileocecal valve,                            appendiceal orifice, and rectum were photographed.                            The bowel preparation used was SUPREP via split                            dose instruction. Scope In: 11:17:30 AM Scope Out: 11:31:53 AM Scope Withdrawal Time: 0 hours 11 minutes 53 seconds  Total Procedure Duration: 0 hours 14 minutes 23 seconds  Findings:                 The perianal and digital rectal examinations were                            normal.                           A 7 mm polyp was found in the rectum. The polyp was                            sessile. The polyp was removed with a cold snare.  Resection and retrieval were complete. Coagulation                            for hemostasis of bleeding caused by the procedure                            using snare was successful. Estimated blood loss                            was minimal.                           Multiple diverticula were found in the sigmoid                            colon.                           The exam was otherwise without abnormality on                            direct and retroflexion views. Complications:            No immediate complications. Estimated Blood Loss:     Estimated blood loss was minimal. Impression:               - One 7 mm polyp in the rectum, removed with a cold                            snare. Resected and retrieved. Treated with a hot                            snare - tip cautery due to persistent ooze of                            blood. .                           -  Diverticulosis in the sigmoid colon.                           - The examination was otherwise normal on direct                            and retroflexion views. Recommendation:           - Patient has a contact number available for                            emergencies. The signs and symptoms of potential                            delayed complications were discussed with the                            patient. Return to normal activities tomorrow.  Written discharge instructions were provided to the                            patient.                           - Resume previous diet.                           - Continue present medications.                           - Repeat colonoscopy is recommended. The                            colonoscopy date will be determined after pathology                            results from today's exam become available for                            review. Iva Boop, MD 08/08/2023 11:37:30 AM This report has been signed electronically.

## 2023-08-08 NOTE — Progress Notes (Signed)
Patient states there have been no changes to medical or surgical history since time of pre-visit. 

## 2023-08-09 ENCOUNTER — Telehealth: Payer: Self-pay

## 2023-08-09 NOTE — Telephone Encounter (Signed)
Follow up call to pt, lm for pt to call if having any difficulty with normal activities or eating and drinking.  Also to call if any other questions or concerns.  

## 2023-08-12 ENCOUNTER — Encounter: Payer: Self-pay | Admitting: Internal Medicine

## 2023-08-12 DIAGNOSIS — Z800 Family history of malignant neoplasm of digestive organs: Secondary | ICD-10-CM | POA: Insufficient documentation

## 2023-08-12 DIAGNOSIS — Z860101 Personal history of adenomatous and serrated colon polyps: Secondary | ICD-10-CM | POA: Insufficient documentation

## 2023-08-12 HISTORY — DX: Personal history of adenomatous and serrated colon polyps: Z86.0101

## 2023-08-12 LAB — SURGICAL PATHOLOGY

## 2023-08-22 ENCOUNTER — Other Ambulatory Visit: Payer: Self-pay

## 2023-08-22 DIAGNOSIS — Z79890 Hormone replacement therapy (postmenopausal): Secondary | ICD-10-CM

## 2023-08-22 NOTE — Telephone Encounter (Signed)
 Pt LVM in med refill line stating she needs refills on HRT patch.

## 2023-08-23 NOTE — Telephone Encounter (Signed)
 Med refill request: estradiol 0.0375mg  patch Last AEX: 10/30/22 Next AEX: none scheduled  Last MMG (if hormonal med) 11/12/22 BI-RADS 1 negative Refill sent to provider for approval or denial as appropriate.

## 2023-08-25 MED ORDER — ESTRADIOL 0.0375 MG/24HR TD PTWK: 0.0375 mg | MEDICATED_PATCH | TRANSDERMAL | 0 refills | Status: DC

## 2023-08-27 ENCOUNTER — Other Ambulatory Visit (HOSPITAL_COMMUNITY): Payer: Self-pay | Admitting: Family Medicine

## 2023-08-27 DIAGNOSIS — E7800 Pure hypercholesterolemia, unspecified: Secondary | ICD-10-CM

## 2023-09-05 ENCOUNTER — Ambulatory Visit (HOSPITAL_COMMUNITY)
Admission: RE | Admit: 2023-09-05 | Discharge: 2023-09-05 | Disposition: A | Discharge status: Home / Self Care | Payer: Self-pay | Source: Ambulatory Visit | Attending: Family Medicine | Admitting: Family Medicine

## 2023-09-05 DIAGNOSIS — E7800 Pure hypercholesterolemia, unspecified: Secondary | ICD-10-CM | POA: Insufficient documentation

## 2023-10-28 ENCOUNTER — Other Ambulatory Visit: Payer: Self-pay | Admitting: Nurse Practitioner

## 2023-10-28 DIAGNOSIS — Z1231 Encounter for screening mammogram for malignant neoplasm of breast: Secondary | ICD-10-CM

## 2023-10-30 ENCOUNTER — Ambulatory Visit: Payer: 59 | Admitting: Nurse Practitioner

## 2023-11-01 ENCOUNTER — Other Ambulatory Visit: Payer: Self-pay

## 2023-11-01 DIAGNOSIS — Z79890 Hormone replacement therapy (postmenopausal): Secondary | ICD-10-CM

## 2023-11-01 MED ORDER — PROGESTERONE MICRONIZED 100 MG PO CAPS: 100.0000 mg | ORAL_CAPSULE | Freq: Every evening | ORAL | 0 refills | Status: DC

## 2023-11-01 NOTE — Telephone Encounter (Signed)
 Med refill request: progesterone 100 mg Last AEX: 10/30/22 Next AEX: 11/26/23 Last MMG (if hormonal med) 11/12/22 BI-RADS 1 negative Refill authorized: progesterone 100 mg #30, zero refills.  Please approve or deny as appropriate.

## 2023-11-19 ENCOUNTER — Other Ambulatory Visit: Payer: Self-pay

## 2023-11-19 ENCOUNTER — Ambulatory Visit
Admission: RE | Admit: 2023-11-19 | Discharge: 2023-11-19 | Disposition: A | Discharge status: Home / Self Care | Source: Ambulatory Visit | Attending: Nurse Practitioner | Admitting: Nurse Practitioner

## 2023-11-19 DIAGNOSIS — Z79890 Hormone replacement therapy (postmenopausal): Secondary | ICD-10-CM

## 2023-11-19 DIAGNOSIS — Z1231 Encounter for screening mammogram for malignant neoplasm of breast: Secondary | ICD-10-CM

## 2023-11-19 MED ORDER — ESTRADIOL 0.0375 MG/24HR TD PTWK: 0.0375 mg | MEDICATED_PATCH | TRANSDERMAL | 0 refills | Status: DC

## 2023-11-19 NOTE — Telephone Encounter (Signed)
 Med refill request: Estradiol patches  Last AEX: 10/30/22 Next AEX: 11/26/23 Last MMG (if hormonal med)11/12/22 birads cat 1 neg  Refill authorized: Last rx 08/25/23 #12 with 0 refills. Please approve or deny

## 2023-11-26 ENCOUNTER — Encounter: Payer: Self-pay | Admitting: Nurse Practitioner

## 2023-11-26 ENCOUNTER — Ambulatory Visit (INDEPENDENT_AMBULATORY_CARE_PROVIDER_SITE_OTHER): Payer: 59 | Admitting: Nurse Practitioner

## 2023-11-26 VITALS — BP 122/80 | HR 94 | Ht 65.0 in | Wt 191.0 lb

## 2023-11-26 DIAGNOSIS — Z79890 Hormone replacement therapy (postmenopausal): Secondary | ICD-10-CM

## 2023-11-26 DIAGNOSIS — Z1331 Encounter for screening for depression: Secondary | ICD-10-CM

## 2023-11-26 DIAGNOSIS — Z01419 Encounter for gynecological examination (general) (routine) without abnormal findings: Secondary | ICD-10-CM

## 2023-11-26 MED ORDER — PROGESTERONE MICRONIZED 100 MG PO CAPS: 100.0000 mg | ORAL_CAPSULE | Freq: Every evening | ORAL | 3 refills | Status: AC

## 2023-11-26 MED ORDER — CLIMARA 0.0375 MG/24HR TD PTWK: 0.0375 mg | MEDICATED_PATCH | TRANSDERMAL | 3 refills | Status: DC

## 2023-11-26 NOTE — Progress Notes (Signed)
 LISA-MARIE RUEGER 01/27/1971 [id number]   History:  53 y.o. G2P2 presents for annual exam. Postmenopausal - on HRT. Discontinued last year and had significant symptoms, so she restarted. 1976 LEEP, normal paps since. HLD managed by PCP.   Gynecologic History No LMP recorded (lmp unknown). Patient is postmenopausal.   Contraception/Family planning: post menopausal status Sexually active: Yes  Health Maintenance Last Pap: 10/30/2022. Results were: Normal neg HPV Last mammogram: 11/19/2023. Results were: Normal Last colonoscopy: 08/08/2023. Results were: Adenoma, 5-year recall Last Dexa: Not indicated  Past medical history, past surgical history, family history and social history were all reviewed and documented in the EPIC chart. Boyfriend. Accountant. 22 yo daughter. 28 yo son. Father with history of colon cancer at age 91.   ROS:  A ROS was performed and pertinent positives and negatives are included.  Exam:  Vitals:   11/26/23 1502  BP: 122/80  Pulse: 94  SpO2: (!) 86%  Weight: 191 lb (86.6 kg)  Height: 5\' 5"  (1.651 m)     Body mass index is 31.78 kg/m.  General appearance:  Normal Thyroid:  Symmetrical, normal in size, without palpable masses or nodularity. Respiratory  Auscultation:  Clear without wheezing or rhonchi Cardiovascular  Auscultation:  Regular rate, without rubs, murmurs or gallops  Edema/varicosities:  Not grossly evident Abdominal  Soft,nontender, without masses, guarding or rebound.  Liver/spleen:  No organomegaly noted  Hernia:  None appreciated  Skin  Inspection:  Grossly normal Breasts: Examined lying and sitting.   Right: Without masses, retractions, nipple discharge or axillary adenopathy.   Left: Without masses, retractions, nipple discharge or axillary adenopathy. Pelvic: External genitalia:  no lesions              Urethra:  normal appearing urethra with no masses, tenderness or lesions              Bartholins and Skenes: normal                  Vagina: normal appearing vagina with normal color and discharge, no lesions              Cervix: no lesions Bimanual Exam:  Uterus:  no masses or tenderness              Adnexa: no mass, fullness, tenderness              Rectovaginal: Deferred              Anus:  normal, no lesions  Patient informed chaperone available to be present for breast and pelvic exam. Patient has requested no chaperone to be present. Patient has been advised what will be completed during breast and pelvic exam.   Assessment/Plan:  53 y.o. G2P2 for annual exam.   Well female exam with routine gynecological exam - Education provided on SBEs, importance of preventative screenings, current guidelines, high calcium diet, regular exercise, and multivitamin daily. Labs with PCP last month.    Postmenopausal hormone therapy - Plan: estradiol (CLIMARA) 0.0375 mg/24hr patch weekly, progesterone (PROMETRIUM) 100 MG capsule nightly. Doing well on this and wants to continue.   Screening for cervical cancer - 1976 LEEP, normal paps since. Will repeat at 5-year interval per guidelines.   Screening for breast cancer - Normal mammogram history. Continue annual screenings. Normal breast exam today.  Screening for colon cancer -  07/2023 colonoscopy. Will repeat at GI's recommended interval. Father with history of colon cancer at age 61.  Return in about 1  year (around 11/25/2024) for Annual.    Olivia Mackie DNP, 3:17 PM 11/26/2023

## 2023-11-28 ENCOUNTER — Other Ambulatory Visit: Payer: Self-pay | Admitting: Nurse Practitioner

## 2023-11-28 DIAGNOSIS — Z79890 Hormone replacement therapy (postmenopausal): Secondary | ICD-10-CM

## 2023-11-28 NOTE — Telephone Encounter (Signed)
 Rx refused, duplicate rx request. Medication was sent 11/26/23. Routing to provider for review

## 2024-02-13 ENCOUNTER — Other Ambulatory Visit: Payer: Self-pay

## 2024-02-13 DIAGNOSIS — Z79890 Hormone replacement therapy (postmenopausal): Secondary | ICD-10-CM

## 2024-02-13 MED ORDER — ESTRADIOL 0.0375 MG/24HR TD PTWK: 0.0375 mg | MEDICATED_PATCH | TRANSDERMAL | 3 refills | Status: AC

## 2024-02-13 NOTE — Telephone Encounter (Signed)
 Patient left VM on RF line:  estrogen 0.0375 mg patches did not have refills. RX was sent 11/26/23 for #12 with 3 refills. I spoke to the pharmacist at [employer] was signed DAW and insurance will not cover [employer].  Pharmacist requests new RX. Med refill request: estradiol  0.0375 mg patch Last AEX: 11/26/23  Next AEX: none scheduled Last MMG (if hormonal med) BI-RADS 1 negative Refill authorized: Please approve or deny as appropriate.
